# Patient Record
Sex: Female | Born: 1955 | Race: Black or African American | Hispanic: No | Smoking: Current every day smoker
Health system: Southern US, Community
[De-identification: ages and names within clinical notes are randomized; demographics above are authoritative.]

## PROBLEM LIST (undated history)

## (undated) DIAGNOSIS — E7800 Pure hypercholesterolemia, unspecified: Secondary | ICD-10-CM

## (undated) DIAGNOSIS — J40 Bronchitis, not specified as acute or chronic: Secondary | ICD-10-CM

## (undated) DIAGNOSIS — I10 Essential (primary) hypertension: Secondary | ICD-10-CM

## (undated) HISTORY — DX: Essential (primary) hypertension: I10

---

## 2018-07-05 ENCOUNTER — Emergency Department (HOSPITAL_BASED_OUTPATIENT_CLINIC_OR_DEPARTMENT_OTHER)
Admission: EM | Admit: 2018-07-05 | Discharge: 2018-07-05 | Disposition: A | Discharge status: Home / Self Care | Payer: Managed Care, Other (non HMO) | Attending: Emergency Medicine | Admitting: Emergency Medicine

## 2018-07-05 ENCOUNTER — Encounter (HOSPITAL_BASED_OUTPATIENT_CLINIC_OR_DEPARTMENT_OTHER): Payer: Self-pay | Admitting: Emergency Medicine

## 2018-07-05 ENCOUNTER — Other Ambulatory Visit: Payer: Self-pay

## 2018-07-05 ENCOUNTER — Emergency Department (HOSPITAL_BASED_OUTPATIENT_CLINIC_OR_DEPARTMENT_OTHER): Payer: Managed Care, Other (non HMO)

## 2018-07-05 DIAGNOSIS — F17210 Nicotine dependence, cigarettes, uncomplicated: Secondary | ICD-10-CM | POA: Insufficient documentation

## 2018-07-05 DIAGNOSIS — B9789 Other viral agents as the cause of diseases classified elsewhere: Secondary | ICD-10-CM

## 2018-07-05 DIAGNOSIS — J069 Acute upper respiratory infection, unspecified: Secondary | ICD-10-CM | POA: Diagnosis not present

## 2018-07-05 DIAGNOSIS — R0602 Shortness of breath: Secondary | ICD-10-CM | POA: Diagnosis present

## 2018-07-05 HISTORY — DX: Pure hypercholesterolemia, unspecified: E78.00

## 2018-07-05 HISTORY — DX: Bronchitis, not specified as acute or chronic: J40

## 2018-07-05 MED ORDER — BENZONATATE 100 MG PO CAPS: 200.0000 mg | ORAL_CAPSULE | Freq: Three times a day (TID) | ORAL | 0 refills | Status: AC | PRN

## 2018-07-05 MED ORDER — AEROCHAMBER PLUS FLO-VU MEDIUM MISC
1.0000 | Freq: Once | Status: DC
  Filled 2018-07-05: qty 1

## 2018-07-05 MED ORDER — ALBUTEROL SULFATE HFA 108 (90 BASE) MCG/ACT IN AERS
2.0000 | INHALATION_SPRAY | Freq: Once | RESPIRATORY_TRACT | Status: AC
  Administered 2018-07-05: 2 via RESPIRATORY_TRACT
  Filled 2018-07-05: qty 6.7

## 2018-07-05 MED FILL — BENZONATATE 100 MG CAP: 100 | 6 days supply | Qty: 20 | Fill #0

## 2018-07-05 NOTE — ED Triage Notes (Signed)
Dry cough started this morning.  SOB upon awakening this morning.

## 2018-07-05 NOTE — ED Notes (Signed)
Pt walked in the dept with a steady gate -- heart rate-120 -- SpO2-100% Room Air

## 2018-07-05 NOTE — ED Notes (Signed)
Patient transported to X-ray 

## 2018-07-05 NOTE — ED Notes (Signed)
Not initiating any triage orders at this time.  EDP to be in room momentarily.

## 2018-07-05 NOTE — ED Provider Notes (Signed)
[facility] EMERGENCY DEPARTMENT Provider Note   CSN: [id number] Arrival date & time: 07/05/18  4098     History   Chief Complaint Chief Complaint  Patient presents with  . Shortness of Breath    HPI Angie Roman is a 62 y.o. female.  62yo F w/ PMH including tobacco use, bronchitis, HLD who p/w cough and SOB. Pt reports getting a cough associated w/ nasal congestion yesterday. She's had nausea and 1 episode of diarrhea today. No fevers, mild headache, no chest pain. She reports some shortness of breath.  + sick contacts. She took mucinex last night without relief. No recent travel, leg swelling/pain, estrogen use, h/o clot, or h/o cancer. No SOB symptoms prior to onset of cough.   The history is provided by the patient.  Shortness of Breath     Past Medical History:  Diagnosis Date  . Bronchitis   . High cholesterol     There are no active problems to display for this patient.   History reviewed. No pertinent surgical history.   OB History   No obstetric history on file.      Home Medications    Prior to Admission medications   Medication Sig Start Date End Date Taking? Authorizing Provider  benzonatate (TESSALON) 100 MG capsule Take 2 capsules (200 mg total) by mouth 3 (three) times daily as needed for up to 20 days for cough. 07/05/18 07/25/18  Risa Auman, Ambrose Finland, MD    Family History No family history on file.  Social History Social History   Tobacco Use  . Smoking status: Current Every Day Smoker    Packs/day: 0.50    Types: Cigarettes  . Smokeless tobacco: Never Used  Substance Use Topics  . Alcohol use: Never    Frequency: Never  . Drug use: Never     Allergies   Patient has no known allergies.   Review of Systems Review of Systems  Respiratory: Positive for shortness of breath.    All other systems reviewed and are negative except that which was mentioned in HPI  Physical Exam Updated Vital Signs BP (!) 158/90 (BP  Location: Right Arm)   Pulse (!) 106   Temp 99.1 F (37.3 C) (Oral)   Resp (!) 28   Ht 5\' 5"  (1.651 m)   Wt 86.2 kg   SpO2 99%   BMI 31.62 kg/m   Physical Exam Vitals signs and nursing note reviewed.  Constitutional:      General: She is not in acute distress.    Appearance: She is well-developed.  HENT:     Head: Normocephalic and atraumatic.     Mouth/Throat:     Mouth: Mucous membranes are moist.     Pharynx: No pharyngeal swelling or oropharyngeal exudate.  Eyes:     Conjunctiva/sclera: Conjunctivae normal.  Neck:     Musculoskeletal: Neck supple.  Cardiovascular:     Rate and Rhythm: Normal rate and regular rhythm.     Heart sounds: Normal heart sounds. No murmur.  Pulmonary:     Effort: Pulmonary effort is normal.     Comments: Mildly diminished breath sounds b/l without wheezing Abdominal:     General: Bowel sounds are normal. There is no distension.     Palpations: Abdomen is soft.     Tenderness: There is no abdominal tenderness.  Musculoskeletal:     Right lower leg: She exhibits no tenderness. No edema.     Left lower leg: She exhibits no tenderness. No  edema.  Skin:    General: Skin is warm and dry.  Neurological:     Mental Status: She is alert and oriented to person, place, and time.     Comments: Fluent speech  Psychiatric:        Judgment: Judgment normal.      ED Treatments / Results  Labs (all labs ordered are listed, but only abnormal results are displayed) Labs Reviewed - No data to display  EKG None  Radiology Dg Chest 2 View  Result Date: 07/05/2018 CLINICAL DATA:  Cough and shortness of breath for 2 days EXAM: CHEST - 2 VIEW COMPARISON:  None. FINDINGS: The heart size and mediastinal contours are within normal limits. Both lungs are clear. The visualized skeletal structures are unremarkable. IMPRESSION: No active cardiopulmonary disease. Electronically Signed   By: Alcide CleverMark  Lukens M.D.   On: 07/05/2018 09:11    Procedures Procedures  (including critical care time)  Medications Ordered in ED Medications  AEROCHAMBER PLUS FLO-VU MEDIUM MISC 1 each (has no administration in time range)  albuterol (PROVENTIL HFA;VENTOLIN HFA) 108 (90 Base) MCG/ACT inhaler 2 puff (2 puffs Inhalation Given 07/05/18 0842)     Initial Impression / Assessment and Plan / ED Course  I have reviewed the triage vital signs and the nursing notes.  Pertinent  imaging results that were available during my care of the patient were reviewed by me and considered in my medical decision making (see chart for details).    PT comfortable on exam, O2 sat 100% on RA. CXR normal. Denies any symptoms of SOB prior to onset of coughing, no risk factors for PE. No chest pain. She feels improved after albuterol. No wheezing on repeat exam therefore no indication for steroids. She had normal WOB and 100% on RA when first entering room, becomes anxious and mildly tachypneic when I am talking to her. Suspect component of anxiety contributing to symptoms. Ambulated in ED with normal O2 sats.  PCP follow-up in a few days for reassessment and extensively reviewed return precautions.  She voiced understanding.  Final Clinical Impressions(s) / ED Diagnoses   Final diagnoses:  Viral URI with cough    ED Discharge Orders         Ordered    benzonatate (TESSALON) 100 MG capsule  3 times daily PRN     07/05/18 1023           Amaiya Scruton, Ambrose Finlandachel Morgan, MD 07/05/18 1029

## 2020-05-06 DIAGNOSIS — Z1339 Encounter for screening examination for other mental health and behavioral disorders: Secondary | ICD-10-CM | POA: Diagnosis not present

## 2020-05-06 DIAGNOSIS — R0602 Shortness of breath: Secondary | ICD-10-CM | POA: Diagnosis not present

## 2020-05-06 DIAGNOSIS — Z20822 Contact with and (suspected) exposure to covid-19: Secondary | ICD-10-CM | POA: Diagnosis not present

## 2020-05-06 DIAGNOSIS — Z114 Encounter for screening for human immunodeficiency virus [HIV]: Secondary | ICD-10-CM | POA: Diagnosis not present

## 2020-05-06 DIAGNOSIS — D539 Nutritional anemia, unspecified: Secondary | ICD-10-CM | POA: Diagnosis not present

## 2020-05-06 DIAGNOSIS — Z23 Encounter for immunization: Secondary | ICD-10-CM | POA: Diagnosis not present

## 2020-05-06 DIAGNOSIS — Z79899 Other long term (current) drug therapy: Secondary | ICD-10-CM | POA: Diagnosis not present

## 2020-05-06 DIAGNOSIS — Z711 Person with feared health complaint in whom no diagnosis is made: Secondary | ICD-10-CM | POA: Diagnosis not present

## 2020-05-06 DIAGNOSIS — Z1331 Encounter for screening for depression: Secondary | ICD-10-CM | POA: Diagnosis not present

## 2020-05-06 DIAGNOSIS — Z131 Encounter for screening for diabetes mellitus: Secondary | ICD-10-CM | POA: Diagnosis not present

## 2020-05-06 DIAGNOSIS — Z0000 Encounter for general adult medical examination without abnormal findings: Secondary | ICD-10-CM | POA: Diagnosis not present

## 2020-05-06 DIAGNOSIS — F17210 Nicotine dependence, cigarettes, uncomplicated: Secondary | ICD-10-CM | POA: Diagnosis not present

## 2020-05-06 DIAGNOSIS — M5116 Intervertebral disc disorders with radiculopathy, lumbar region: Secondary | ICD-10-CM | POA: Diagnosis not present

## 2020-05-06 DIAGNOSIS — E559 Vitamin D deficiency, unspecified: Secondary | ICD-10-CM | POA: Diagnosis not present

## 2020-05-06 DIAGNOSIS — Z6834 Body mass index (BMI) 34.0-34.9, adult: Secondary | ICD-10-CM | POA: Diagnosis not present

## 2020-05-06 DIAGNOSIS — E782 Mixed hyperlipidemia: Secondary | ICD-10-CM | POA: Diagnosis not present

## 2021-04-13 ENCOUNTER — Other Ambulatory Visit: Payer: Self-pay

## 2021-04-13 ENCOUNTER — Other Ambulatory Visit (HOSPITAL_BASED_OUTPATIENT_CLINIC_OR_DEPARTMENT_OTHER): Payer: Self-pay

## 2021-04-13 ENCOUNTER — Encounter: Payer: Self-pay | Admitting: *Deleted

## 2021-04-13 ENCOUNTER — Emergency Department (HOSPITAL_BASED_OUTPATIENT_CLINIC_OR_DEPARTMENT_OTHER)
Admission: EM | Admit: 2021-04-13 | Discharge: 2021-04-13 | Disposition: A | Discharge status: Home / Self Care | Payer: BC Managed Care – PPO | Attending: Emergency Medicine | Admitting: Emergency Medicine

## 2021-04-13 ENCOUNTER — Emergency Department (HOSPITAL_BASED_OUTPATIENT_CLINIC_OR_DEPARTMENT_OTHER): Payer: BC Managed Care – PPO

## 2021-04-13 ENCOUNTER — Encounter (HOSPITAL_BASED_OUTPATIENT_CLINIC_OR_DEPARTMENT_OTHER): Payer: Self-pay

## 2021-04-13 DIAGNOSIS — F17210 Nicotine dependence, cigarettes, uncomplicated: Secondary | ICD-10-CM | POA: Diagnosis not present

## 2021-04-13 DIAGNOSIS — K520 Gastroenteritis and colitis due to radiation: Secondary | ICD-10-CM | POA: Diagnosis not present

## 2021-04-13 DIAGNOSIS — R590 Localized enlarged lymph nodes: Secondary | ICD-10-CM | POA: Insufficient documentation

## 2021-04-13 DIAGNOSIS — R197 Diarrhea, unspecified: Secondary | ICD-10-CM | POA: Diagnosis not present

## 2021-04-13 DIAGNOSIS — K529 Noninfective gastroenteritis and colitis, unspecified: Secondary | ICD-10-CM | POA: Insufficient documentation

## 2021-04-13 DIAGNOSIS — R110 Nausea: Secondary | ICD-10-CM | POA: Diagnosis not present

## 2021-04-13 DIAGNOSIS — I700 Atherosclerosis of aorta: Secondary | ICD-10-CM | POA: Diagnosis not present

## 2021-04-13 DIAGNOSIS — K6389 Other specified diseases of intestine: Secondary | ICD-10-CM | POA: Diagnosis not present

## 2021-04-13 LAB — CBC WITH DIFFERENTIAL/PLATELET
Abs Immature Granulocytes: 0.03 10*3/uL (ref 0.00–0.07)
Basophils Absolute: 0 10*3/uL (ref 0.0–0.1)
Basophils Relative: 1 %
Eosinophils Absolute: 0 10*3/uL (ref 0.0–0.5)
Eosinophils Relative: 0 %
HCT: 40.9 % (ref 36.0–46.0)
Hemoglobin: 13.7 g/dL (ref 12.0–15.0)
Immature Granulocytes: 0 %
Lymphocytes Relative: 25 %
Lymphs Abs: 2.2 10*3/uL (ref 0.7–4.0)
MCH: 29 pg (ref 26.0–34.0)
MCHC: 33.5 g/dL (ref 30.0–36.0)
MCV: 86.7 fL (ref 80.0–100.0)
Monocytes Absolute: 0.6 10*3/uL (ref 0.1–1.0)
Monocytes Relative: 7 %
Neutro Abs: 5.8 10*3/uL (ref 1.7–7.7)
Neutrophils Relative %: 67 %
Platelets: 263 10*3/uL (ref 150–400)
RBC: 4.72 MIL/uL (ref 3.87–5.11)
RDW: 12.6 % (ref 11.5–15.5)
WBC: 8.8 10*3/uL (ref 4.0–10.5)
nRBC: 0 % (ref 0.0–0.2)

## 2021-04-13 LAB — COMPREHENSIVE METABOLIC PANEL
ALT: 18 U/L (ref 0–44)
AST: 22 U/L (ref 15–41)
Albumin: 4.1 g/dL (ref 3.5–5.0)
Alkaline Phosphatase: 87 U/L (ref 38–126)
Anion gap: 7 (ref 5–15)
BUN: 13 mg/dL (ref 8–23)
CO2: 26 mmol/L (ref 22–32)
Calcium: 9.1 mg/dL (ref 8.9–10.3)
Chloride: 106 mmol/L (ref 98–111)
Creatinine, Ser: 0.55 mg/dL (ref 0.44–1.00)
GFR, Estimated: >60 mL/min (ref >60)
Glucose, Bld: 109 mg/dL — ABNORMAL HIGH (ref 70–99)
Potassium: 3.3 mmol/L — ABNORMAL LOW (ref 3.5–5.1)
Sodium: 139 mmol/L (ref 135–145)
Total Bilirubin: 0.4 mg/dL (ref 0.3–1.2)
Total Protein: 7.4 g/dL (ref 6.5–8.1)

## 2021-04-13 LAB — LIPASE, BLOOD: Lipase: 25 U/L (ref 11–51)

## 2021-04-13 MED ORDER — IOHEXOL 350 MG/ML SOLN
100.0000 mL | Freq: Once | INTRAVENOUS | Status: AC | PRN
  Administered 2021-04-13: 85 mL via INTRAVENOUS

## 2021-04-13 MED ORDER — AZITHROMYCIN 500 MG PO TABS
500.0000 mg | ORAL_TABLET | Freq: Every day | ORAL | 0 refills | Status: AC
  Filled 2021-04-13: qty 5, 5d supply, fill #0

## 2021-04-13 MED ORDER — SODIUM CHLORIDE 0.9 % IV BOLUS
500.0000 mL | Freq: Once | INTRAVENOUS | Status: AC
  Administered 2021-04-13: 500 mL via INTRAVENOUS

## 2021-04-13 MED ORDER — DICYCLOMINE HCL 20 MG PO TABS
20.0000 mg | ORAL_TABLET | Freq: Three times a day (TID) | ORAL | 0 refills | Status: DC | PRN
  Filled 2021-04-13: qty 20, 7d supply, fill #0

## 2021-04-13 NOTE — ED Provider Notes (Signed)
Emergency Department Provider Note   I have reviewed the triage vital signs and the nursing notes.   HISTORY  Chief Complaint Diarrhea   HPI Angie Roman is a 65 y.o. female past medical history reviewed below presents to the emergency department with lower abdominal pain and some bloody/mucousy diarrhea.  Patient has not had fevers.  She denies recent antibiotics or hospitalization.  She is not having rectal pain.  Symptoms began yesterday and she has been passing small volume (1-2 tablespoons) worth of diarrhea.  She brought a sample to the emergency department.  She is not anticoagulated.  Denies any dysuria, hesitancy, urgency.  No radiation of symptoms or other modifying factors.   Past Medical History:  Diagnosis Date   Bronchitis    High cholesterol     There are no problems to display for this patient.   History reviewed. No pertinent surgical history.  Allergies Patient has no known allergies.  No family history on file.  Social History Social History   Tobacco Use   Smoking status: Every Day    Packs/day: 0.50    Types: Cigarettes   Smokeless tobacco: Never  Vaping Use   Vaping Use: Never used  Substance Use Topics   Alcohol use: Yes    Comment: occ   Drug use: Never    Review of Systems  Constitutional: No fever/chills Eyes: No visual changes. ENT: No sore throat. Cardiovascular: Denies chest pain. Respiratory: Denies shortness of breath. Gastrointestinal: Positive lower abdominal pain.  No nausea, no vomiting. Positive bloody/mucous diarrhea.  No constipation. Genitourinary: Negative for dysuria. Musculoskeletal: Negative for back pain. Skin: Negative for rash. Neurological: Negative for headaches, focal weakness or numbness.  10-point ROS otherwise negative.  ____________________________________________   PHYSICAL EXAM:  VITAL SIGNS: ED Triage Vitals  Enc Vitals Group     BP 04/13/21 1155 (!) 156/92     Pulse Rate 04/13/21 1155  99     Resp 04/13/21 1155 18     Temp 04/13/21 1155 98.8 F (37.1 C)     Temp Source 04/13/21 1155 Oral     SpO2 04/13/21 1155 98 %     Weight 04/13/21 1157 201 lb (91.2 kg)     Height 04/13/21 1157 5\' 5"  (1.651 m)   Constitutional: Alert and oriented. Well appearing and in no acute distress. Eyes: Conjunctivae are normal.  Head: Atraumatic. Nose: No congestion/rhinnorhea. Mouth/Throat: Mucous membranes are moist.   Neck: No stridor.   Cardiovascular: Normal rate, regular rhythm. Good peripheral circulation. Grossly normal heart sounds.   Respiratory: Normal respiratory effort.  No retractions. Lungs CTAB. Gastrointestinal: Soft and nontender. No distention.  Musculoskeletal: No gross deformities of extremities. Neurologic:  Normal speech and language. No gross focal neurologic deficits are appreciated.  Skin:  Skin is warm, dry and intact. No rash noted.   ____________________________________________   LABS (all labs ordered are listed, but only abnormal results are displayed)  Labs Reviewed  COMPREHENSIVE METABOLIC PANEL - Abnormal; Notable for the following components:      Result Value   Potassium 3.3 (*)    Glucose, Bld 109 (*)    All other components within normal limits  GASTROINTESTINAL PANEL BY PCR, STOOL (REPLACES STOOL CULTURE)  LIPASE, BLOOD  CBC WITH DIFFERENTIAL/PLATELET   ____________________________________________  RADIOLOGY   CT reviewed. No acute findings.   ____________________________________________   PROCEDURES  Procedure(s) performed:   Procedures  None ____________________________________________   INITIAL IMPRESSION / ASSESSMENT AND PLAN / ED COURSE  Pertinent  labs & imaging results that were available during my care of the patient were reviewed by me and considered in my medical decision making (see chart for details).   Patient presents to the emergency department for evaluation of lower abdominal pain with small-volume, bloody  diarrhea.  Patient has a sample at bedside which we will send for C. difficile stool panel. Abdomen with mild lower abdominal tenderness without peritonitis. Plan for labs, IVF, and CT.   Differential diagnosis includes but is not exclusive to ectopic pregnancy, ovarian cyst, ovarian torsion, acute appendicitis, urinary tract infection, endometriosis, bowel obstruction, hernia, colitis, renal colic, gastroenteritis, volvulus etc.   CT with colitis type changes. Discussed lymphadenopathy and referred to Oncology. Patient has a GI specialist as well. Stool studies sent. Plan for abx and GI/Onc follow up. Discussed strict ED return precautions.  ____________________________________________  FINAL CLINICAL IMPRESSION(S) / ED DIAGNOSES  Final diagnoses:  Diarrhea of presumed infectious origin  Colitis  Abdominal lymphadenopathy     MEDICATIONS GIVEN DURING THIS VISIT:  Medications  sodium chloride 0.9 % bolus 500 mL (0 mLs Intravenous Stopped 04/13/21 1505)  iohexol (OMNIPAQUE) 350 MG/ML injection 100 mL (85 mLs Intravenous Contrast Given 04/13/21 1352)     NEW OUTPATIENT MEDICATIONS STARTED DURING THIS VISIT:  Discharge Medication List as of 04/13/2021  3:22 PM     START taking these medications   Details  azithromycin (ZITHROMAX) 500 MG tablet Take 1 tablet (500 mg total) by mouth daily for 5 days., Starting Mon 04/13/2021, Until Sat 04/18/2021, Normal    dicyclomine (BENTYL) 20 MG tablet Take 1 tablet (20 mg total) by mouth 3 (three) times daily as needed for spasms., Starting Mon 04/13/2021, Normal        Note:  This document was prepared using Dragon voice recognition software and may include unintentional dictation errors.  Alona Bene, MD, [facility]    Cashawn Yanko, Arlyss Repress, MD 04/17/21 [phone]

## 2021-04-13 NOTE — ED Notes (Signed)
Patient transported to CT 

## 2021-04-13 NOTE — Discharge Instructions (Signed)
You were seen in the ED today with abdominal pain and diarrhea. Your CT scan shows some inflammation in the colon and swollen lymph nodes in the abdomen and pelvis. The radiology team is recommending follow up with a hematology/oncology group. I have placed a referral but please consult with your primary care doctor for follow up as well. Please also alert your GI doctors, who did your colonoscopy, that you are having bleeding. Return to the ED with any new or worsening symptoms.

## 2021-04-13 NOTE — ED Triage Notes (Signed)
Pt c/o diarrhea, occ abd cramps started ~10pm-diarrhea with blood started ~5am-NAD-steady gait

## 2021-04-13 NOTE — Progress Notes (Signed)
Reached out to Alba Destine to introduce myself as the office RN Navigator and explain our new patient process. Reviewed the reason for their referral and scheduled their new patient appointment along with labs. Provided address and directions to the office including call back phone number. Reviewed with patient any concerns they may have or any possible barriers to attending their appointment.   Informed patient about my role as a navigator and that I will meet with them prior to their New Patient appointment and more fully discuss what services I can provide. At this time patient has no further questions or needs.    Oncology Nurse Navigator Documentation  Oncology Nurse Navigator Flowsheets 04/13/2021  Abnormal Finding Date 04/13/2021  Diagnosis Status Additional Work Up  Navigator Follow Up Date: 04/20/2021  Navigator Follow Up Reason: Abnormal Scan  Navigator Location [facility]  Referral Date to RadOnc/MedOnc 04/13/2021  Navigator Encounter Type Introductory Phone Call  Patient Visit Type MedOnc  Treatment Phase Abnormal Scans  Barriers/Navigation Needs Coordination of Care;Education  Education Other  Interventions Coordination of Care;Education  Acuity Level 2-Minimal Needs (1-2 Barriers Identified)  Coordination of Care Appts  Education Method Verbal  Time Spent with Patient 45

## 2021-04-14 LAB — GASTROINTESTINAL PANEL BY PCR, STOOL (REPLACES STOOL CULTURE)

## 2021-04-17 ENCOUNTER — Telehealth: Payer: Self-pay | Admitting: *Deleted

## 2021-04-17 DIAGNOSIS — R197 Diarrhea, unspecified: Secondary | ICD-10-CM | POA: Diagnosis not present

## 2021-04-17 DIAGNOSIS — R590 Localized enlarged lymph nodes: Secondary | ICD-10-CM | POA: Diagnosis not present

## 2021-04-17 DIAGNOSIS — R110 Nausea: Secondary | ICD-10-CM | POA: Diagnosis not present

## 2021-04-17 DIAGNOSIS — K219 Gastro-esophageal reflux disease without esophagitis: Secondary | ICD-10-CM | POA: Diagnosis not present

## 2021-04-17 NOTE — Telephone Encounter (Signed)
Message received from patient stating that she would like to cancel appts scheduled for Monday, 04/20/21 and that she will call to reschedule.  Appts canceled per pt.'s request.

## 2021-04-20 ENCOUNTER — Other Ambulatory Visit: Payer: BC Managed Care – PPO

## 2021-04-20 ENCOUNTER — Ambulatory Visit: Payer: BC Managed Care – PPO | Admitting: Hematology & Oncology

## 2021-04-21 ENCOUNTER — Telehealth: Payer: Self-pay | Admitting: *Deleted

## 2021-04-21 ENCOUNTER — Encounter: Payer: Self-pay | Admitting: *Deleted

## 2021-04-21 DIAGNOSIS — R590 Localized enlarged lymph nodes: Secondary | ICD-10-CM | POA: Diagnosis not present

## 2021-04-21 DIAGNOSIS — K219 Gastro-esophageal reflux disease without esophagitis: Secondary | ICD-10-CM | POA: Diagnosis not present

## 2021-04-21 DIAGNOSIS — K449 Diaphragmatic hernia without obstruction or gangrene: Secondary | ICD-10-CM | POA: Diagnosis not present

## 2021-04-21 DIAGNOSIS — K640 First degree hemorrhoids: Secondary | ICD-10-CM | POA: Diagnosis not present

## 2021-04-21 DIAGNOSIS — D125 Benign neoplasm of sigmoid colon: Secondary | ICD-10-CM | POA: Diagnosis not present

## 2021-04-21 DIAGNOSIS — R197 Diarrhea, unspecified: Secondary | ICD-10-CM | POA: Diagnosis not present

## 2021-04-21 DIAGNOSIS — K635 Polyp of colon: Secondary | ICD-10-CM | POA: Diagnosis not present

## 2021-04-21 DIAGNOSIS — K921 Melena: Secondary | ICD-10-CM | POA: Diagnosis not present

## 2021-04-21 DIAGNOSIS — R110 Nausea: Secondary | ICD-10-CM | POA: Diagnosis not present

## 2021-04-21 DIAGNOSIS — K3189 Other diseases of stomach and duodenum: Secondary | ICD-10-CM | POA: Diagnosis not present

## 2021-04-21 HISTORY — PX: COLONOSCOPY WITH ESOPHAGOGASTRODUODENOSCOPY (EGD): SHX5779

## 2021-04-21 NOTE — Progress Notes (Signed)
Patient had to cancel her New Patient appointment for 04/20/2021. She called back today to reschedule to 04/27/21.  Oncology Nurse Navigator Documentation  Oncology Nurse Navigator Flowsheets 04/21/2021  Abnormal Finding Date -  Diagnosis Status -  Navigator Follow Up Date: 04/27/2021  Navigator Follow Up Reason: New Patient Appointment  Navigator Location [facility]  Referral Date to RadOnc/MedOnc -  Navigator Encounter Type Appt/Treatment Plan Review  Patient Visit Type MedOnc  Treatment Phase Abnormal Scans  Barriers/Navigation Needs -  Education -  Interventions None Required  Acuity Level 2-Minimal Needs (1-2 Barriers Identified)  Coordination of Care -  Education Method -  Time Spent with Patient 15

## 2021-04-21 NOTE — Telephone Encounter (Signed)
Referral has been deferred until patient calls back to reschedule appointment.

## 2021-04-21 NOTE — Telephone Encounter (Signed)
Patient called to reschedule new patient appointment with Dr. Myna Hidalgo - confirmed - mailed welcome packet with calendar.

## 2021-04-27 ENCOUNTER — Encounter: Payer: Self-pay | Admitting: *Deleted

## 2021-04-27 ENCOUNTER — Inpatient Hospital Stay (HOSPITAL_BASED_OUTPATIENT_CLINIC_OR_DEPARTMENT_OTHER): Payer: BC Managed Care – PPO | Admitting: Hematology & Oncology

## 2021-04-27 ENCOUNTER — Encounter: Payer: Self-pay | Admitting: Hematology & Oncology

## 2021-04-27 ENCOUNTER — Other Ambulatory Visit: Payer: Self-pay

## 2021-04-27 ENCOUNTER — Inpatient Hospital Stay: Payer: BC Managed Care – PPO | Attending: Hematology & Oncology

## 2021-04-27 VITALS — BP 164/93 | HR 79 | Temp 98.3°F | Resp 18 | Ht 65.0 in | Wt 206.0 lb

## 2021-04-27 DIAGNOSIS — R590 Localized enlarged lymph nodes: Secondary | ICD-10-CM | POA: Insufficient documentation

## 2021-04-27 DIAGNOSIS — F17210 Nicotine dependence, cigarettes, uncomplicated: Secondary | ICD-10-CM | POA: Diagnosis not present

## 2021-04-27 DIAGNOSIS — Z803 Family history of malignant neoplasm of breast: Secondary | ICD-10-CM | POA: Insufficient documentation

## 2021-04-27 LAB — RETICULOCYTES
Immature Retic Fract: 7.2 % (ref 2.3–15.9)
RBC.: 4.68 MIL/uL (ref 3.87–5.11)
Retic Count, Absolute: 76.8 10*3/uL (ref 19.0–186.0)
Retic Ct Pct: 1.6 % (ref 0.4–3.1)

## 2021-04-27 LAB — CMP (CANCER CENTER ONLY)
ALT: 14 U/L (ref 0–44)
AST: 13 U/L — ABNORMAL LOW (ref 15–41)
Albumin: 4.3 g/dL (ref 3.5–5.0)
Alkaline Phosphatase: 79 U/L (ref 38–126)
Anion gap: 6 (ref 5–15)
BUN: 13 mg/dL (ref 8–23)
CO2: 30 mmol/L (ref 22–32)
Calcium: 10 mg/dL (ref 8.9–10.3)
Chloride: 103 mmol/L (ref 98–111)
Creatinine: 0.68 mg/dL (ref 0.44–1.00)
GFR, Estimated: >60 mL/min (ref >60)
Glucose, Bld: 96 mg/dL (ref 70–99)
Potassium: 4.1 mmol/L (ref 3.5–5.1)
Sodium: 139 mmol/L (ref 135–145)
Total Bilirubin: 0.4 mg/dL (ref 0.3–1.2)
Total Protein: 7.1 g/dL (ref 6.5–8.1)

## 2021-04-27 LAB — CBC WITH DIFFERENTIAL (CANCER CENTER ONLY)
Abs Immature Granulocytes: 0.04 10*3/uL (ref 0.00–0.07)
Basophils Absolute: 0 10*3/uL (ref 0.0–0.1)
Basophils Relative: 1 %
Eosinophils Absolute: 0 10*3/uL (ref 0.0–0.5)
Eosinophils Relative: 0 %
HCT: 41.6 % (ref 36.0–46.0)
Hemoglobin: 13.6 g/dL (ref 12.0–15.0)
Immature Granulocytes: 1 %
Lymphocytes Relative: 40 %
Lymphs Abs: 2.2 10*3/uL (ref 0.7–4.0)
MCH: 28.8 pg (ref 26.0–34.0)
MCHC: 32.7 g/dL (ref 30.0–36.0)
MCV: 87.9 fL (ref 80.0–100.0)
Monocytes Absolute: 0.3 10*3/uL (ref 0.1–1.0)
Monocytes Relative: 5 %
Neutro Abs: 2.9 10*3/uL (ref 1.7–7.7)
Neutrophils Relative %: 53 %
Platelet Count: 265 10*3/uL (ref 150–400)
RBC: 4.73 MIL/uL (ref 3.87–5.11)
RDW: 12.3 % (ref 11.5–15.5)
WBC Count: 5.6 10*3/uL (ref 4.0–10.5)
nRBC: 0 % (ref 0.0–0.2)

## 2021-04-27 LAB — SAVE SMEAR(SSMR), FOR PROVIDER SLIDE REVIEW

## 2021-04-27 LAB — LACTATE DEHYDROGENASE: LDH: 189 U/L (ref 98–192)

## 2021-04-27 NOTE — Progress Notes (Signed)
Initial RN Navigator Patient Visit  Name: Angie Roman Date of Referral : 04/13/2021 Diagnosis: Lymphadenopathy  Met with patient prior to their visit with MD. Hanley Seamen patient "Your Patient Navigator" handout which explains my role, areas in which I am able to help, and all the contact information for myself and the office. Also gave patient MD and Navigator business card. Reviewed with patient the general overview of expected course after initial diagnosis and time frame for all steps to be completed.  Patient here alone. She lives with a 14yo granddaughter and has many supportive family members in the area. She still works but has a flexible schedule.   Patient completed visit with Dr. Marin Olp.   Patient will need a PET scan. Will wait for insurance authorization and then schedule.   Patient understands all follow up procedures and expectations. They have my number to reach out for any further clarification or additional needs.   Oncology Nurse Navigator Documentation  Oncology Nurse Navigator Flowsheets 04/27/2021  Abnormal Finding Date -  Diagnosis Status -  Navigator Follow Up Date: 04/29/2021  Navigator Follow Up Reason: Appointment Review  Navigator Location [facility]  Referral Date to RadOnc/MedOnc -  Navigator Encounter Type Initial MedOnc  Patient Visit Type MedOnc  Treatment Phase Abnormal Scans  Barriers/Navigation Needs Coordination of Care;Education  Education Other  Interventions Education;Psycho-Social Support  Acuity Level 2-Minimal Needs (1-2 Barriers Identified)  Coordination of Care -  Education Method Verbal;Written  Support Groups/Services Friends and Family  Time Spent with Patient 30

## 2021-04-27 NOTE — Progress Notes (Signed)
Referral MD  Reason for Referral: Pelvic lymphadenopathy  Chief Complaint  Patient presents with   New Patient (Initial Visit)  : I was told I had some big lymph nodes.  HPI: Angie Roman is a very charming 65 year old Afro-American female.  She is from [place].  She has a son.  She currently is helping take care of a granddaughter.  She works for a Product/process development scientist.  She has been very healthy.  She really has not had any medications that she has been taking.  She is up-to-date with her mammogram.  She has not had a hysterectomy.  She went to the emergency room back in September.  She had a lot of abdominal pain and diarrhea with blood.  She had nausea.  She did have a CT scan done on 04/13/2021.  This showed diffuse thickening of the distal transverse and descending colon insistent with a colitis.  However, there was some lymphadenopathy in the pelvis.  She had right inguinal lymph node measuring 1.3 cm.  Left inguinal lymph node measured 2.4 cm.  There is some enlarged left common iliac lymph nodes and other lymph nodes in the pelvis.  No biopsy have been done.  No additional studies have been done.  She was calmly referred to the [facility] for an evaluation.  She has had no problems since this bout of colitis.  She has had a colonoscopy and upper endoscopy. This was done on 04/21/2021.  The colonoscopy showed some polyps that were removed.  The upper endoscopy was unremarkable.  She has had no weight loss or weight gain.  Her appetite is doing okay.  She has had no cough or shortness of breath.  There is been no problems with COVID.  She does not smoke.  She probably has about a 40-pack-year history of tobacco use.  There is no history in the family of a type of abdominal issues.  There is history of breast cancer in the family.  She still has her own uterus and ovaries.  She has had no rashes.  There is been no swollen lymph nodes.  She has had no bladder issues.   She has had no bleeding.  Overall, I would have to say that her performance status is ECOG 0.   Past Medical History:  Diagnosis Date   Bronchitis    High cholesterol   :  History reviewed. No pertinent surgical history.:   Current Outpatient Medications:    cyclobenzaprine (FLEXERIL) 10 MG tablet, Take 5-10 mg by mouth every 8 (eight) hours as needed., Disp: , Rfl:    dicyclomine (BENTYL) 20 MG tablet, Take 1 tablet (20 mg total) by mouth 3 (three) times daily as needed for spasms., Disp: 20 tablet, Rfl: 0:  :   Allergies  Allergen Reactions   Iodinated Diagnostic Agents Rash  :  History reviewed. No pertinent family history.:   Social History   Socioeconomic History   Marital status: Widowed    Spouse name: Not on file   Number of children: Not on file   Years of education: Not on file   Highest education level: Not on file  Occupational History   Not on file  Tobacco Use   Smoking status: Every Day    Packs/day: 0.50    Types: Cigarettes   Smokeless tobacco: Never  Vaping Use   Vaping Use: Never used  Substance and Sexual Activity   Alcohol use: Yes    Comment: occ  Drug use: Never   Sexual activity: Not on file  Other Topics Concern   Not on file  Social History Narrative   Not on file   Social Determinants of Health   Financial Resource Strain: Not on file  Food Insecurity: Not on file  Transportation Needs: Not on file  Physical Activity: Not on file  Stress: Not on file  Social Connections: Not on file  Intimate Partner Violence: Not on file  :  Review of Systems  Constitutional: Negative.   HENT: Negative.    Eyes: Negative.   Respiratory: Negative.    Cardiovascular: Negative.   Gastrointestinal: Negative.   Genitourinary: Negative.   Musculoskeletal: Negative.   Skin: Negative.   Neurological: Negative.   Endo/Heme/Allergies: Negative.   Psychiatric/Behavioral: Negative.      Exam: @IPVITALS @ Physical Exam Vitals  reviewed.  HENT:     Head: Normocephalic and atraumatic.  Eyes:     Pupils: Pupils are equal, round, and reactive to light.  Cardiovascular:     Rate and Rhythm: Normal rate and regular rhythm.     Heart sounds: Normal heart sounds.  Pulmonary:     Effort: Pulmonary effort is normal.     Breath sounds: Normal breath sounds.  Abdominal:     General: Bowel sounds are normal.     Palpations: Abdomen is soft.  Musculoskeletal:        General: No tenderness or deformity. Normal range of motion.     Cervical back: Normal range of motion.  Lymphadenopathy:     Cervical: No cervical adenopathy.  Skin:    General: Skin is warm and dry.     Findings: No erythema or rash.  Neurological:     Mental Status: She is alert and oriented to person, place, and time.  Psychiatric:        Behavior: Behavior normal.        Thought Content: Thought content normal.        Judgment: Judgment normal.      Recent Labs    04/27/21 1109  WBC 5.6  HGB 13.6  HCT 41.6  PLT 265    Recent Labs    04/27/21 1109  NA 139  K 4.1  CL 103  CO2 30  GLUCOSE 96  BUN 13  CREATININE 0.68  CALCIUM 10.0    Blood smear review: None  Pathology: None    Assessment and Plan: Angie Roman is a very charming 65 year old African-American female.  She had a CT scan that showed some pelvic lymphadenopathy.  This is totally nondescriptive.  It is possible that this might be malignant.  It could be reactive.  I think that the best way to go is to get a PET scan on her.  We can see if these lymph nodes are active and how active they are.  We can see if there is lymph nodes elsewhere.  I would have to think there frequently get a biopsy, this positive and had to be some kind of surgical procedure.  She looks quite healthy.  She has a great performance status.  She has a strong faith.  I gave her a prayer blanket.  I told her that we just do not know what is going on as of yet.  The PET scan can certainly guide  76 as to how we can try to proceed.  Ultimately, she will need to have a biopsy done.  Once we have the PET scan result back, I will get  back with her and then we can figure out what our next step will be.

## 2021-04-28 ENCOUNTER — Telehealth: Payer: Self-pay | Admitting: *Deleted

## 2021-04-28 LAB — IRON AND TIBC
Iron: 71 ug/dL (ref 41–142)
Saturation Ratios: 24 % (ref 21–57)
TIBC: 295 ug/dL (ref 236–444)
UIBC: 224 ug/dL (ref 120–384)

## 2021-04-28 LAB — BETA 2 MICROGLOBULIN, SERUM: Beta-2 Microglobulin: 1.1 mg/L (ref 0.6–2.4)

## 2021-04-28 LAB — FERRITIN: Ferritin: 110 ng/mL (ref 11–307)

## 2021-04-28 LAB — CA 125: Cancer Antigen (CA) 125: 2.2 U/mL (ref 0.0–38.1)

## 2021-04-28 LAB — CEA (IN HOUSE-CHCC): CEA (CHCC-In House): 1.91 ng/mL (ref 0.00–5.00)

## 2021-04-28 NOTE — Telephone Encounter (Signed)
No 04/27/21 LOS 

## 2021-04-29 ENCOUNTER — Encounter: Payer: Self-pay | Admitting: *Deleted

## 2021-04-29 NOTE — Progress Notes (Signed)
PET approved. Scheduled for 05/11/2021.  Called patient and reviewed appointment with her, including time, date and location. Also reviewed prep with her including arrival time, and NPO except water. She confirmed teaching. I also mailed her a calendar and radiology info sheet with all the above for education reinforcement.   Oncology Nurse Navigator Documentation  Oncology Nurse Navigator Flowsheets 04/29/2021  Abnormal Finding Date -  Diagnosis Status -  Navigator Follow Up Date: 05/11/2021  Navigator Follow Up Reason: Scan Review  Navigator Location [facility]  Referral Date to RadOnc/MedOnc -  Navigator Encounter Type Appt/Treatment Plan Review;Telephone  Telephone Education;Appt Confirmation/Clarification;Outgoing Call  Patient Visit Type MedOnc  Treatment Phase Abnormal Scans  Barriers/Navigation Needs Coordination of Care;Education  Education Other  Interventions Coordination of Care;Education;Psycho-Social Support  Acuity Level 2-Minimal Needs (1-2 Barriers Identified)  Coordination of Care Radiology  Education Method Verbal;Teach-back;Written  Support Groups/Services Friends and Family  Time Spent with Patient 30

## 2021-05-11 ENCOUNTER — Other Ambulatory Visit: Payer: Self-pay

## 2021-05-11 ENCOUNTER — Ambulatory Visit (HOSPITAL_COMMUNITY)
Admission: RE | Admit: 2021-05-11 | Discharge: 2021-05-11 | Disposition: A | Discharge status: Home / Self Care | Payer: BC Managed Care – PPO | Source: Ambulatory Visit | Attending: Hematology & Oncology | Admitting: Hematology & Oncology

## 2021-05-11 DIAGNOSIS — R590 Localized enlarged lymph nodes: Secondary | ICD-10-CM | POA: Diagnosis not present

## 2021-05-11 DIAGNOSIS — I700 Atherosclerosis of aorta: Secondary | ICD-10-CM | POA: Diagnosis not present

## 2021-05-11 DIAGNOSIS — J351 Hypertrophy of tonsils: Secondary | ICD-10-CM | POA: Diagnosis not present

## 2021-05-11 DIAGNOSIS — I2510 Atherosclerotic heart disease of native coronary artery without angina pectoris: Secondary | ICD-10-CM | POA: Diagnosis not present

## 2021-05-11 LAB — GLUCOSE, CAPILLARY: Glucose-Capillary: 104 mg/dL — ABNORMAL HIGH (ref 70–99)

## 2021-05-11 MED ORDER — FLUDEOXYGLUCOSE F - 18 (FDG) INJECTION
10.6000 | Freq: Once | INTRAVENOUS | Status: AC | PRN
  Administered 2021-05-11: 10.58 via INTRAVENOUS

## 2021-05-12 ENCOUNTER — Encounter: Payer: Self-pay | Admitting: *Deleted

## 2021-05-12 NOTE — Progress Notes (Signed)
Reviewed PET scan. Message sent to Dr Myna Hidalgo to determine next step.   Oncology Nurse Navigator Documentation  Oncology Nurse Navigator Flowsheets 05/12/2021  Abnormal Finding Date -  Diagnosis Status -  Navigator Follow Up Date: -  Navigator Follow Up Reason: -  Navigator Location [facility]  Referral Date to RadOnc/MedOnc -  Navigator Encounter Type Scan Review  Telephone -  Patient Visit Type MedOnc  Treatment Phase Abnormal Scans  Barriers/Navigation Needs Coordination of Care;Education  Education -  Interventions Coordination of Care  Acuity Level 2-Minimal Needs (1-2 Barriers Identified)  Coordination of Care Other  Education Method -  Support Groups/Services -  Time Spent with Patient 15

## 2021-05-13 ENCOUNTER — Encounter: Payer: Self-pay | Admitting: *Deleted

## 2021-05-13 DIAGNOSIS — R590 Localized enlarged lymph nodes: Secondary | ICD-10-CM

## 2021-05-13 NOTE — Progress Notes (Signed)
Received the following message regarding the PET results:  Demonica Farrey:  we need to get her to surgery for an exscional  bx.  We need to have an entire node removed!!  Pete  Referral order placed to [facility] for excisional LN biopsy.   Called patient and she is aware of PET results and that [facility] will be calling her to schedule an appointment to discuss biopsy. Requested that she call me 05/15/2021 if she hadn't heard from them.  Oncology Nurse Navigator Documentation  Oncology Nurse Navigator Flowsheets 05/13/2021  Abnormal Finding Date -  Diagnosis Status -  Navigator Follow Up Date: 05/15/2021  Navigator Follow Up Reason: Appointment Review  Navigator Location [facility]  Referral Date to RadOnc/MedOnc -  Navigator Encounter Type Telephone  Telephone Diagnostic Results;Appt Confirmation/Clarification;Outgoing Call  Patient Visit Type MedOnc  Treatment Phase Abnormal Scans  Barriers/Navigation Needs Coordination of Care;Education  Education Other  Interventions Education;Psycho-Social Support;Referrals  Acuity Level 2-Minimal Needs (1-2 Barriers Identified)  Referrals Other  Coordination of Care -  Education Method Verbal  Support Groups/Services Friends and Family  Time Spent with Patient 30

## 2021-05-15 ENCOUNTER — Encounter: Payer: Self-pay | Admitting: *Deleted

## 2021-05-15 NOTE — Progress Notes (Signed)
Oncology Nurse Navigator Documentation  Oncology Nurse Navigator Flowsheets 05/15/2021  Abnormal Finding Date -  Diagnosis Status -  Navigator Follow Up Date: 05/29/2021  Navigator Follow Up Reason: Other:  Navigator Location [facility]  Referral Date to RadOnc/MedOnc -  Navigator Encounter Type Appt/Treatment Plan Review  Telephone -  Patient Visit Type MedOnc  Treatment Phase Abnormal Scans  Barriers/Navigation Needs Coordination of Care;Education  Education -  Interventions None Required  Acuity Level 2-Minimal Needs (1-2 Barriers Identified)  Referrals -  Coordination of Care -  Education Method -  Support Groups/Services Friends and Family  Time Spent with Patient 15

## 2021-05-23 DIAGNOSIS — Z1231 Encounter for screening mammogram for malignant neoplasm of breast: Secondary | ICD-10-CM | POA: Diagnosis not present

## 2021-05-23 DIAGNOSIS — Z1239 Encounter for other screening for malignant neoplasm of breast: Secondary | ICD-10-CM | POA: Diagnosis not present

## 2021-05-29 ENCOUNTER — Encounter: Payer: Self-pay | Admitting: *Deleted

## 2021-05-29 ENCOUNTER — Ambulatory Visit: Payer: Self-pay | Admitting: Surgery

## 2021-05-29 DIAGNOSIS — R590 Localized enlarged lymph nodes: Secondary | ICD-10-CM | POA: Diagnosis not present

## 2021-05-29 NOTE — Progress Notes (Signed)
Patient seen by [facility] and an excisional biopsy is scheduled for 06/05/21.  Oncology Nurse Navigator Documentation  Oncology Nurse Navigator Flowsheets 05/29/2021  Abnormal Finding Date -  Diagnosis Status -  Navigator Follow Up Date: 06/05/2021  Navigator Follow Up Reason: Surgery  Navigator Location [facility]  Referral Date to RadOnc/MedOnc -  Navigator Encounter Type Appt/Treatment Plan Review  Telephone -  Patient Visit Type MedOnc  Treatment Phase Abnormal Scans  Barriers/Navigation Needs Coordination of Care;Education  Education -  Interventions None Required  Acuity Level 2-Minimal Needs (1-2 Barriers Identified)  Referrals -  Coordination of Care -  Education Method -  Support Groups/Services Friends and Family  Time Spent with Patient 15

## 2021-06-03 ENCOUNTER — Encounter (HOSPITAL_BASED_OUTPATIENT_CLINIC_OR_DEPARTMENT_OTHER): Payer: Self-pay | Admitting: Surgery

## 2021-06-03 ENCOUNTER — Other Ambulatory Visit: Payer: Self-pay

## 2021-06-03 NOTE — Progress Notes (Signed)
Spoke w/ via phone for pre-op interview---pt Lab needs dos---- none              Lab results------none COVID test -----patient states asymptomatic no test needed Arrive at -------1315 on 06/05/21 NPO after MN NO Solid Food.  Clear liquids from MN until---1215 Med rec completed Medications to take morning of surgery -----Flexeril prn Diabetic medication -----n/a Patient instructed no nail polish to be worn day of surgery Patient instructed to bring photo id and insurance card day of surgery Patient aware to have Driver (ride ) / caregiver    for 24 hours after surgery  - cousin [place] Patient Special Instructions -----none Pre-Op special Istructions -----none Patient verbalized understanding of instructions that were given at this phone interview. Patient denies shortness of breath, chest pain, fever, cough at this phone interview.

## 2021-06-05 ENCOUNTER — Ambulatory Visit (HOSPITAL_BASED_OUTPATIENT_CLINIC_OR_DEPARTMENT_OTHER)
Admission: RE | Admit: 2021-06-05 | Discharge: 2021-06-05 | Disposition: A | Discharge status: Home / Self Care | Payer: BC Managed Care – PPO | Attending: Surgery | Admitting: Surgery

## 2021-06-05 ENCOUNTER — Encounter (HOSPITAL_BASED_OUTPATIENT_CLINIC_OR_DEPARTMENT_OTHER)
Admission: RE | Disposition: A | Discharge status: Home / Self Care | Payer: Self-pay | Source: Home / Self Care | Attending: Surgery

## 2021-06-05 ENCOUNTER — Ambulatory Visit (HOSPITAL_BASED_OUTPATIENT_CLINIC_OR_DEPARTMENT_OTHER): Payer: BC Managed Care – PPO | Admitting: Anesthesiology

## 2021-06-05 ENCOUNTER — Encounter (HOSPITAL_BASED_OUTPATIENT_CLINIC_OR_DEPARTMENT_OTHER): Payer: Self-pay | Admitting: Surgery

## 2021-06-05 ENCOUNTER — Encounter: Payer: Self-pay | Admitting: *Deleted

## 2021-06-05 DIAGNOSIS — F17210 Nicotine dependence, cigarettes, uncomplicated: Secondary | ICD-10-CM | POA: Insufficient documentation

## 2021-06-05 DIAGNOSIS — J40 Bronchitis, not specified as acute or chronic: Secondary | ICD-10-CM | POA: Diagnosis not present

## 2021-06-05 DIAGNOSIS — R590 Localized enlarged lymph nodes: Secondary | ICD-10-CM | POA: Diagnosis not present

## 2021-06-05 DIAGNOSIS — E7800 Pure hypercholesterolemia, unspecified: Secondary | ICD-10-CM | POA: Diagnosis not present

## 2021-06-05 HISTORY — PX: INGUINAL LYMPH NODE BIOPSY: SHX5865

## 2021-06-05 SURGERY — BIOPSY, LYMPH NODE, INGUINAL, OPEN
Anesthesia: General | Site: Groin | Laterality: Left

## 2021-06-05 MED ORDER — LIDOCAINE 2% (20 MG/ML) 5 ML SYRINGE
INTRAMUSCULAR | Status: AC
  Filled 2021-06-05: qty 5

## 2021-06-05 MED ORDER — LIDOCAINE 2% (20 MG/ML) 5 ML SYRINGE
INTRAMUSCULAR | Status: DC | PRN
  Administered 2021-06-05: 100 mg via INTRAVENOUS

## 2021-06-05 MED ORDER — 0.9 % SODIUM CHLORIDE (POUR BTL) OPTIME
TOPICAL | Status: DC | PRN
  Administered 2021-06-05: 500 mL

## 2021-06-05 MED ORDER — FENTANYL CITRATE (PF) 100 MCG/2ML IJ SOLN
INTRAMUSCULAR | Status: DC | PRN
  Administered 2021-06-05: 50 ug via INTRAVENOUS

## 2021-06-05 MED ORDER — MIDAZOLAM HCL 5 MG/5ML IJ SOLN
INTRAMUSCULAR | Status: DC | PRN
Start: 2021-06-05 — End: 2021-06-05
  Administered 2021-06-05: 2 mg via INTRAVENOUS

## 2021-06-05 MED ORDER — ONDANSETRON HCL 4 MG/2ML IJ SOLN
INTRAMUSCULAR | Status: DC | PRN
  Administered 2021-06-05: 4 mg via INTRAVENOUS

## 2021-06-05 MED ORDER — CELECOXIB 200 MG PO CAPS
ORAL_CAPSULE | ORAL | Status: AC
  Filled 2021-06-05: qty 2

## 2021-06-05 MED ORDER — GABAPENTIN 300 MG PO CAPS
300.0000 mg | ORAL_CAPSULE | ORAL | Status: AC
  Administered 2021-06-05: 300 mg via ORAL

## 2021-06-05 MED ORDER — DEXAMETHASONE SODIUM PHOSPHATE 10 MG/ML IJ SOLN
INTRAMUSCULAR | Status: DC | PRN
  Administered 2021-06-05: 10 mg via INTRAVENOUS

## 2021-06-05 MED ORDER — GABAPENTIN 300 MG PO CAPS
ORAL_CAPSULE | ORAL | Status: AC
  Filled 2021-06-05: qty 1

## 2021-06-05 MED ORDER — OXYCODONE HCL 5 MG/5ML PO SOLN: 5.0000 mg | Freq: Once | ORAL | Status: DC | PRN

## 2021-06-05 MED ORDER — OXYCODONE HCL 5 MG PO TABS: 5.0000 mg | ORAL_TABLET | Freq: Once | ORAL | Status: DC | PRN

## 2021-06-05 MED ORDER — CELECOXIB 200 MG PO CAPS
400.0000 mg | ORAL_CAPSULE | ORAL | Status: AC
  Administered 2021-06-05: 400 mg via ORAL

## 2021-06-05 MED ORDER — OXYCODONE-ACETAMINOPHEN 5-325 MG PO TABS: 1.0000 | ORAL_TABLET | ORAL | 0 refills | Status: DC | PRN

## 2021-06-05 MED ORDER — LACTATED RINGERS IV SOLN: INTRAVENOUS | Status: DC

## 2021-06-05 MED ORDER — ONDANSETRON HCL 4 MG/2ML IJ SOLN: 4.0000 mg | Freq: Once | INTRAMUSCULAR | Status: DC | PRN

## 2021-06-05 MED ORDER — ACETAMINOPHEN 500 MG PO TABS
ORAL_TABLET | ORAL | Status: AC
  Filled 2021-06-05: qty 2

## 2021-06-05 MED ORDER — ONDANSETRON HCL 4 MG/2ML IJ SOLN
INTRAMUSCULAR | Status: AC
  Filled 2021-06-05: qty 2

## 2021-06-05 MED ORDER — DEXAMETHASONE SODIUM PHOSPHATE 10 MG/ML IJ SOLN
INTRAMUSCULAR | Status: AC
  Filled 2021-06-05: qty 1

## 2021-06-05 MED ORDER — ACETAMINOPHEN 500 MG PO TABS
1000.0000 mg | ORAL_TABLET | ORAL | Status: AC
  Administered 2021-06-05: 1000 mg via ORAL

## 2021-06-05 MED ORDER — FENTANYL CITRATE (PF) 100 MCG/2ML IJ SOLN
INTRAMUSCULAR | Status: AC
  Filled 2021-06-05: qty 2

## 2021-06-05 MED ORDER — BUPIVACAINE-EPINEPHRINE 0.25% -1:200000 IJ SOLN
INTRAMUSCULAR | Status: DC | PRN
  Administered 2021-06-05: 10 mL

## 2021-06-05 MED ORDER — CHLORHEXIDINE GLUCONATE CLOTH 2 % EX PADS: 6.0000 | MEDICATED_PAD | Freq: Once | CUTANEOUS | Status: DC

## 2021-06-05 MED ORDER — FENTANYL CITRATE (PF) 100 MCG/2ML IJ SOLN: 25.0000 ug | INTRAMUSCULAR | Status: DC | PRN

## 2021-06-05 MED ORDER — PROPOFOL 10 MG/ML IV BOLUS
INTRAVENOUS | Status: DC | PRN
  Administered 2021-06-05: 200 mg via INTRAVENOUS

## 2021-06-05 MED ORDER — CEFAZOLIN SODIUM-DEXTROSE 2-4 GM/100ML-% IV SOLN
2.0000 g | INTRAVENOUS | Status: AC
  Administered 2021-06-05: 2 g via INTRAVENOUS

## 2021-06-05 MED ORDER — MIDAZOLAM HCL 2 MG/2ML IJ SOLN
INTRAMUSCULAR | Status: AC
  Filled 2021-06-05: qty 2

## 2021-06-05 MED ORDER — CEFAZOLIN SODIUM-DEXTROSE 2-4 GM/100ML-% IV SOLN
INTRAVENOUS | Status: AC
  Filled 2021-06-05: qty 100

## 2021-06-05 SURGICAL SUPPLY — 45 items
ADH SKN CLS APL DERMABOND .7 (GAUZE/BANDAGES/DRESSINGS) ×1
APL PRP STRL LF DISP 70% ISPRP (MISCELLANEOUS)
BLADE SURG 15 STRL LF DISP TIS (BLADE) ×1 IMPLANT
BLADE SURG 15 STRL SS (BLADE) ×2
CHLORAPREP W/TINT 26 (MISCELLANEOUS) IMPLANT
CNTNR URN SCR LID CUP LEK RST (MISCELLANEOUS) ×1 IMPLANT
CONT SPEC 4OZ STRL OR WHT (MISCELLANEOUS) ×2
COVER BACK TABLE 60X90IN (DRAPES) ×2 IMPLANT
COVER MAYO STAND STRL (DRAPES) ×2 IMPLANT
COVER SURGICAL LIGHT HANDLE (MISCELLANEOUS) ×2 IMPLANT
DERMABOND ADVANCED (GAUZE/BANDAGES/DRESSINGS) ×1
DERMABOND ADVANCED .7 DNX12 (GAUZE/BANDAGES/DRESSINGS) ×1 IMPLANT
DRAPE LAPAROTOMY 100X72 PEDS (DRAPES) IMPLANT
DRAPE UTILITY XL STRL (DRAPES) ×2 IMPLANT
ELECT REM PT RETURN 9FT ADLT (ELECTROSURGICAL) ×2
ELECTRODE REM PT RTRN 9FT ADLT (ELECTROSURGICAL) ×1 IMPLANT
GAUZE 4X4 16PLY ~~LOC~~+RFID DBL (SPONGE) ×2 IMPLANT
GAUZE SPONGE 4X4 12PLY STRL (GAUZE/BANDAGES/DRESSINGS) ×2 IMPLANT
GLOVE SRG 8 PF TXTR STRL LF DI (GLOVE) ×1 IMPLANT
GLOVE SURG PR MICRO ENCORE 7.5 (GLOVE) ×2 IMPLANT
GLOVE SURG UNDER POLY LF SZ8 (GLOVE) ×2
GOWN STRL REUS W/TWL LRG LVL3 (GOWN DISPOSABLE) ×2 IMPLANT
KIT TURNOVER CYSTO (KITS) ×2 IMPLANT
NEEDLE HYPO 22GX1.5 SAFETY (NEEDLE) IMPLANT
NEEDLE HYPO 25X1 1.5 SAFETY (NEEDLE) IMPLANT
NS IRRIG 500ML POUR BTL (IV SOLUTION) ×2 IMPLANT
PACK BASIN DAY SURGERY FS (CUSTOM PROCEDURE TRAY) ×2 IMPLANT
PENCIL SMOKE EVACUATOR (MISCELLANEOUS) ×2 IMPLANT
SPONGE T-LAP 18X18 ~~LOC~~+RFID (SPONGE) IMPLANT
SPONGE T-LAP 4X18 ~~LOC~~+RFID (SPONGE) IMPLANT
SUCTION FRAZIER HANDLE 10FR (MISCELLANEOUS)
SUCTION TUBE FRAZIER 10FR DISP (MISCELLANEOUS) IMPLANT
SUT CHROMIC 3 0 SH 27 (SUTURE) IMPLANT
SUT ETHILON 2 0 FS 18 (SUTURE) IMPLANT
SUT MNCRL AB 4-0 PS2 18 (SUTURE) ×2 IMPLANT
SUT VIC AB 2-0 SH 27 (SUTURE)
SUT VIC AB 2-0 SH 27XBRD (SUTURE) IMPLANT
SUT VIC AB 3-0 SH 18 (SUTURE) ×2 IMPLANT
SUT VIC AB 3-0 SH 27 (SUTURE) ×2
SUT VIC AB 3-0 SH 27X BRD (SUTURE) ×1 IMPLANT
SYR BULB IRRIG 60ML STRL (SYRINGE) ×2 IMPLANT
SYR CONTROL 10ML LL (SYRINGE) ×2 IMPLANT
TOWEL OR 17X26 10 PK STRL BLUE (TOWEL DISPOSABLE) ×2 IMPLANT
TUBE CONNECTING 12X1/4 (SUCTIONS) ×2 IMPLANT
YANKAUER SUCT BULB TIP NO VENT (SUCTIONS) ×2 IMPLANT

## 2021-06-05 NOTE — Transfer of Care (Signed)
Immediate Anesthesia Transfer of Care Note  Patient: Angie Roman  Procedure(s) Performed: LEFT INGUINAL LYMPH NODE BIOPSY (Left: Groin)  Patient Location: PACU  Anesthesia Type:General  Level of Consciousness: awake, alert  and oriented  Airway & Oxygen Therapy: Patient Spontanous Breathing and Patient connected to nasal cannula oxygen  Post-op Assessment: Report given to RN  Post vital signs: Reviewed and stable  Last Vitals:  Vitals Value Taken Time  BP 141/85   Temp    Pulse 93 06/05/21 1633  Resp 25 06/05/21 1633  SpO2 100 % 06/05/21 1633  Vitals shown include unvalidated device data.  Last Pain:  Vitals:   06/05/21 1324  TempSrc: Oral  PainSc: 0-No pain      Patients Stated Pain Goal: 8 (06/05/21 1324)  Complications: No notable events documented.

## 2021-06-05 NOTE — Progress Notes (Signed)
Patient at [facility] for excisional LN biopsy. Will follow for path.  Oncology Nurse Navigator Documentation  Oncology Nurse Navigator Flowsheets 06/05/2021  Abnormal Finding Date -  Diagnosis Status -  Navigator Follow Up Date: 06/09/2021  Navigator Follow Up Reason: Pathology  Navigator Location [facility]  Referral Date to RadOnc/MedOnc -  Navigator Encounter Type Appt/Treatment Plan Review  Telephone -  Patient Visit Type MedOnc  Treatment Phase Abnormal Scans  Barriers/Navigation Needs Coordination of Care;Education  Education -  Interventions None Required  Acuity Level 2-Minimal Needs (1-2 Barriers Identified)  Referrals -  Coordination of Care -  Education Method -  Support Groups/Services Friends and Family  Time Spent with Patient 15

## 2021-06-05 NOTE — Discharge Instructions (Signed)
No acetaminophen/Tylenol until after 7:15pm today if needed for pain.  No ibuprofen, Advil, Aleve, Motrin, ketorolac, meloxicam, naproxen, or other NSAIDS until after 7:15pm today if needed for pain.     Post Anesthesia Home Care Instructions  Activity: Get plenty of rest for the remainder of the day. A responsible individual must stay with you for 24 hours following the procedure.  For the next 24 hours, DO NOT: -Drive a car -Advertising copywriter -Drink alcoholic beverages -Take any medication unless instructed by your physician -Make any legal decisions or sign important papers.  Meals: Start with liquid foods such as gelatin or soup. Progress to regular foods as tolerated. Avoid greasy, spicy, heavy foods. If nausea and/or vomiting occur, drink only clear liquids until the nausea and/or vomiting subsides. Call your physician if vomiting continues.  Special Instructions/Symptoms: Your throat may feel dry or sore from the anesthesia or the breathing tube placed in your throat during surgery. If this causes discomfort, gargle with warm salt water. The discomfort should disappear within 24 hours.

## 2021-06-05 NOTE — Anesthesia Preprocedure Evaluation (Signed)
Anesthesia Evaluation  Patient identified by MRN, date of birth, ID band Patient awake    Reviewed: Allergy & Precautions, H&P , NPO status , Patient's Chart, lab work & pertinent test results  Airway Mallampati: II  TM Distance: >3 FB Neck ROM: Full    Dental no notable dental hx.    Pulmonary Current Smoker,    Pulmonary exam normal breath sounds clear to auscultation       Cardiovascular negative cardio ROS Normal cardiovascular exam Rhythm:Regular Rate:Normal     Neuro/Psych negative neurological ROS  negative psych ROS   GI/Hepatic negative GI ROS, Neg liver ROS,   Endo/Other  negative endocrine ROS  Renal/GU negative Renal ROS  negative genitourinary   Musculoskeletal negative musculoskeletal ROS (+)   Abdominal   Peds negative pediatric ROS (+)  Hematology negative hematology ROS (+)   Anesthesia Other Findings   Reproductive/Obstetrics negative OB ROS                             Anesthesia Physical Anesthesia Plan  ASA: 2  Anesthesia Plan: General   Post-op Pain Management:    Induction: Intravenous  PONV Risk Score and Plan: Ondansetron, Dexamethasone and Treatment may vary due to age or medical condition  Airway Management Planned: LMA  Additional Equipment:   Intra-op Plan:   Post-operative Plan: Extubation in OR  Informed Consent: I have reviewed the patients History and Physical, chart, labs and discussed the procedure including the risks, benefits and alternatives for the proposed anesthesia with the patient or authorized representative who has indicated his/her understanding and acceptance.     Dental advisory given  Plan Discussed with: CRNA and Surgeon  Anesthesia Plan Comments:         Anesthesia Quick Evaluation

## 2021-06-05 NOTE — Op Note (Signed)
   Patient: Angie Roman ([birth year], [id number])  Date of Surgery: 06/05/2021   Preoperative Diagnosis: LEFT INGUINAL LYMPHADENOPATHY   Postoperative Diagnosis: LEFT INGUINAL LYMPHADENOPATHY   Surgical Procedure: LEFT INGUINAL LYMPH NODE BIOPSY: 25053 (CPT)   Operative Team Members:  Surgeon(s) and Role:    * Brylee Berk, Hyman Hopes, MD - Primary   Anesthesiologist: Eilene Ghazi, MD CRNA: Briant Sites, CRNA   Anesthesia: General   Fluids:  Total I/O In: 100 [IV Piggyback:100] Out: -   Complications: None  Drains:  none   Specimen:  ID Type Source Tests Collected by Time Destination  1 : Left inguinal lymph node Tissue PATH Lymph node biopsy SURGICAL PATHOLOGY Saharah Sherrow, Hyman Hopes, MD 06/05/2021 1510      Disposition:  PACU - hemodynamically stable.  Plan of Care: Discharge to home after PACU    Indications for Procedure: Angie Roman is a 65 y.o. female who presented with inguinal lymphadenopathy.  The procedure itself as well as its risks, benefits and alternatives were discussed.  The risks discussed included but were not limited to the risk of infection, bleeding, damage to nearby structures, and wound complication.  After a full discussion and all questions answered the patient granted consent to proceed.  Findings: left inguinal lymphadenopathy   Description of Procedure:   On the date stated above the patient was taken operating suite and placed in supine position.  General LMA anesthesia was induced.  A timeout was completed verifying the correct patient, procedure, positioning, and equipment needed for the case.  The patient's left groin was prepped and draped in usual sterile fashion.  I made an incision just above the inguinal ligament and above her skin folds and dissected down to the fascia of the anterior abdominal wall using electrocautery and blunt dissection to work through the subcutaneous layers including Scarpa's layer.  The fascia of the  inguinal ligament was identified I worked inferiorly to palpate for the lymphadenopathy in the left groin.  A large lymph node was identified and dissected out circumferentially using blunt dissection electrocautery for hemostasis.  The lymph node was passed off the field as a specimen.  The wound was irrigated and then closed in multiple layers with Vicryl suture to close Scarpa's layer, Vicryl suture to close the deep dermal layer and Monocryl and Dermabond to close the skin.  All sponge needle counts were correct at the end of the case.  The specimen was sent fresh to pathology.  At the end of the case we reviewed the infection status of the case. Patient: Private Patient Elective Case Case: Elective Infection Present At Time Of Surgery (PATOS): None  Ivar Drape, MD General, Bariatric, & Minimally Invasive Surgery [facility], [place]

## 2021-06-05 NOTE — Anesthesia Postprocedure Evaluation (Signed)
Anesthesia Post Note  Patient: Angie Roman  Procedure(s) Performed: LEFT INGUINAL LYMPH NODE BIOPSY (Left: Groin)     Patient location during evaluation: PACU Anesthesia Type: General Level of consciousness: awake and alert Pain management: pain level controlled Vital Signs Assessment: post-procedure vital signs reviewed and stable Respiratory status: spontaneous breathing, nonlabored ventilation, respiratory function stable and patient connected to nasal cannula oxygen Cardiovascular status: blood pressure returned to baseline and stable Postop Assessment: no apparent nausea or vomiting Anesthetic complications: no   No notable events documented.  Last Vitals:  Vitals:   06/05/21 1700 06/05/21 1725  BP: (!) 159/81 (!) 173/88  Pulse: 67 70  Resp: 20 17  Temp: (!) 36.3 C 36.4 C  SpO2: 96% 100%    Last Pain:  Vitals:   06/05/21 1725  TempSrc:   PainSc: 2                  Trevor Iha

## 2021-06-05 NOTE — Anesthesia Procedure Notes (Signed)
Procedure Name: LMA Insertion Date/Time: 06/05/2021 3:48 PM Performed by: Briant Sites, CRNA Pre-anesthesia Checklist: Patient identified, Emergency Drugs available, Suction available and Patient being monitored Patient Re-evaluated:Patient Re-evaluated prior to induction Oxygen Delivery Method: Circle system utilized Preoxygenation: Pre-oxygenation with 100% oxygen Induction Type: IV induction Ventilation: Mask ventilation without difficulty LMA: LMA inserted LMA Size: 4.0 Number of attempts: 1 Airway Equipment and Method: Bite block Placement Confirmation: positive ETCO2 Tube secured with: Tape Dental Injury: Teeth and Oropharynx as per pre-operative assessment

## 2021-06-05 NOTE — H&P (Signed)
   Admitting Physician: Hyman Hopes Brantlee Penn  Service: General surgery  CC:   Subjective   HPI: Angie Roman is an 65 y.o. female who is here for left inguinal lymph node biopsy  Past Medical History:  Diagnosis Date   Bronchitis    High cholesterol     Past Surgical History:  Procedure Laterality Date   COLONOSCOPY WITH ESOPHAGOGASTRODUODENOSCOPY (EGD)  04/21/2021    History reviewed. No pertinent family history.  Social:  reports that she has been smoking cigarettes. She has been smoking an average of .5 packs per day. She has never used smokeless tobacco. She reports current alcohol use. She reports that she does not use drugs.  Allergies:  Allergies  Allergen Reactions   Iodinated Diagnostic Agents Rash    Medications: Current Outpatient Medications  Medication Instructions   cyclobenzaprine (FLEXERIL) 5-10 mg, Oral, Every 8 hours PRN    ROS - all of the below systems have been reviewed with the patient and positives are indicated with bold text General: chills, fever or night sweats Eyes: blurry vision or double vision ENT: epistaxis or sore throat Allergy/Immunology: itchy/watery eyes or nasal congestion Hematologic/Lymphatic: bleeding problems, blood clots or swollen lymph nodes Endocrine: temperature intolerance or unexpected weight changes Breast: new or changing breast lumps or nipple discharge Resp: cough, shortness of breath, or wheezing CV: chest pain or dyspnea on exertion GI: as per HPI GU: dysuria, trouble voiding, or hematuria MSK: joint pain or joint stiffness Neuro: TIA or stroke symptoms Derm: pruritus and skin lesion changes Psych: anxiety and depression  Objective   PE Blood pressure (!) 188/85, pulse 90, temperature 98.8 F (37.1 C), temperature source Oral, resp. rate 15, height 5\' 5"  (1.651 m), weight 91.2 kg, SpO2 99 %. Constitutional: NAD; conversant; no deformities Eyes: Moist conjunctiva; no lid lag; anicteric; PERRL Neck:  Trachea midline; no thyromegaly Lungs: Normal respiratory effort; no tactile fremitus CV: RRR; no palpable thrills; no pitting edema GI: Abd left inguinal adenopathy palpable; no palpable hepatosplenomegaly MSK: Normal range of motion of extremities; no clubbing/cyanosis Psychiatric: Appropriate affect; alert and oriented x3 Lymphatic: No palpable cervical or axillary lymphadenopathy  No results found for this or any previous visit (from the past 24 hour(s)).  Imaging Orders  No imaging studies ordered today     Assessment and Plan   Angie Roman is an 65 y.o. female with lymphadenopathy here for left inguinal lymph node biopsy.  The procedure itself as well as its risks, benefits and alternatives were discussed and the patient granted consent to proceed.  We will proceed as scheduled.  76, MD  [facility] Use [email or web address] to contact on call provider

## 2021-06-08 ENCOUNTER — Encounter (HOSPITAL_BASED_OUTPATIENT_CLINIC_OR_DEPARTMENT_OTHER): Payer: Self-pay | Admitting: Surgery

## 2021-06-12 LAB — SURGICAL PATHOLOGY

## 2021-06-15 ENCOUNTER — Encounter: Payer: Self-pay | Admitting: *Deleted

## 2021-06-15 NOTE — Progress Notes (Signed)
Reviewed path with Dr Myna Hidalgo. No cancer diagnosis at this time. We will follow up with patient in 4-6 weeks. Message sent to scheduler.  Patient called with results.  Oncology Nurse Navigator Documentation  Oncology Nurse Navigator Flowsheets 06/15/2021  Abnormal Finding Date -  Diagnosis Status -  Navigator Follow Up Date: -  Navigator Follow Up Reason: -  Navigation Complete Date: 06/15/2021  Post Navigation: Continue to Follow Patient? No  Reason Not Navigating Patient: No Cancer Diagnosis  Navigator Location [facility]  Referral Date to RadOnc/MedOnc -  Navigator Encounter Type Pathology Review;Diagnostic Results;Telephone  Telephone Diagnostic Results;Outgoing Call  Patient Visit Type MedOnc  Treatment Phase Abnormal Scans  Barriers/Navigation Needs Coordination of Care;Education  Education Other  Interventions Education;Psycho-Social Support  Acuity Level 2-Minimal Needs (1-2 Barriers Identified)  Referrals -  Coordination of Care Appts  Education Method Verbal  Support Groups/Services Friends and Family  Time Spent with Patient 30

## 2021-06-16 ENCOUNTER — Telehealth: Payer: Self-pay | Admitting: *Deleted

## 2021-06-16 NOTE — Telephone Encounter (Signed)
Per scheduling message Asher Muir - called and gave upcoming appointments - confirmed

## 2021-07-15 ENCOUNTER — Inpatient Hospital Stay (HOSPITAL_BASED_OUTPATIENT_CLINIC_OR_DEPARTMENT_OTHER): Payer: BC Managed Care – PPO | Admitting: Hematology & Oncology

## 2021-07-15 ENCOUNTER — Encounter: Payer: Self-pay | Admitting: Hematology & Oncology

## 2021-07-15 ENCOUNTER — Encounter: Payer: Self-pay | Admitting: *Deleted

## 2021-07-15 ENCOUNTER — Inpatient Hospital Stay: Payer: BC Managed Care – PPO | Attending: Hematology & Oncology

## 2021-07-15 ENCOUNTER — Other Ambulatory Visit: Payer: Self-pay

## 2021-07-15 VITALS — BP 158/97 | HR 77 | Temp 98.3°F | Resp 18 | Wt 200.0 lb

## 2021-07-15 DIAGNOSIS — Z803 Family history of malignant neoplasm of breast: Secondary | ICD-10-CM | POA: Diagnosis not present

## 2021-07-15 DIAGNOSIS — R590 Localized enlarged lymph nodes: Secondary | ICD-10-CM | POA: Diagnosis not present

## 2021-07-15 DIAGNOSIS — R1900 Intra-abdominal and pelvic swelling, mass and lump, unspecified site: Secondary | ICD-10-CM

## 2021-07-15 DIAGNOSIS — F17210 Nicotine dependence, cigarettes, uncomplicated: Secondary | ICD-10-CM | POA: Insufficient documentation

## 2021-07-15 LAB — CMP (CANCER CENTER ONLY)
ALT: 13 U/L (ref 0–44)
AST: 13 U/L — ABNORMAL LOW (ref 15–41)
Albumin: 4.1 g/dL (ref 3.5–5.0)
Alkaline Phosphatase: 88 U/L (ref 38–126)
Anion gap: 8 (ref 5–15)
BUN: 12 mg/dL (ref 8–23)
CO2: 26 mmol/L (ref 22–32)
Calcium: 9.4 mg/dL (ref 8.9–10.3)
Chloride: 105 mmol/L (ref 98–111)
Creatinine: 0.6 mg/dL (ref 0.44–1.00)
GFR, Estimated: >60 mL/min (ref >60)
Glucose, Bld: 98 mg/dL (ref 70–99)
Potassium: 3.6 mmol/L (ref 3.5–5.1)
Sodium: 139 mmol/L (ref 135–145)
Total Bilirubin: 0.3 mg/dL (ref 0.3–1.2)
Total Protein: 6.9 g/dL (ref 6.5–8.1)

## 2021-07-15 LAB — CBC WITH DIFFERENTIAL (CANCER CENTER ONLY)
Abs Immature Granulocytes: 0.03 10*3/uL (ref 0.00–0.07)
Basophils Absolute: 0 10*3/uL (ref 0.0–0.1)
Basophils Relative: 1 %
Eosinophils Absolute: 0.1 10*3/uL (ref 0.0–0.5)
Eosinophils Relative: 2 %
HCT: 39.1 % (ref 36.0–46.0)
Hemoglobin: 12.9 g/dL (ref 12.0–15.0)
Immature Granulocytes: 1 %
Lymphocytes Relative: 45 %
Lymphs Abs: 2.9 10*3/uL (ref 0.7–4.0)
MCH: 28.6 pg (ref 26.0–34.0)
MCHC: 33 g/dL (ref 30.0–36.0)
MCV: 86.7 fL (ref 80.0–100.0)
Monocytes Absolute: 0.5 10*3/uL (ref 0.1–1.0)
Monocytes Relative: 8 %
Neutro Abs: 2.7 10*3/uL (ref 1.7–7.7)
Neutrophils Relative %: 43 %
Platelet Count: 241 10*3/uL (ref 150–400)
RBC: 4.51 MIL/uL (ref 3.87–5.11)
RDW: 12.4 % (ref 11.5–15.5)
WBC Count: 6.3 10*3/uL (ref 4.0–10.5)
nRBC: 0 % (ref 0.0–0.2)

## 2021-07-15 LAB — RETICULOCYTES
Immature Retic Fract: 5 % (ref 2.3–15.9)
RBC.: 4.43 MIL/uL (ref 3.87–5.11)
Retic Count, Absolute: 68.2 10*3/uL (ref 19.0–186.0)
Retic Ct Pct: 1.5 % (ref 0.4–3.1)

## 2021-07-15 LAB — LACTATE DEHYDROGENASE: LDH: 177 U/L (ref 98–192)

## 2021-07-15 NOTE — Progress Notes (Signed)
Oncology Nurse Navigator Documentation  Oncology Nurse Navigator Flowsheets 07/15/2021  Abnormal Finding Date -  Diagnosis Status -  Navigator Follow Up Date: -  Navigator Follow Up Reason: -  Navigation Complete Date: 07/15/2021  Post Navigation: Continue to Follow Patient? No  Reason Not Navigating Patient: No Cancer Diagnosis  Navigator Location [facility]  Referral Date to RadOnc/MedOnc -  Navigator Encounter Type Follow-up Appt  Telephone -  Patient Visit Type MedOnc  Treatment Phase Abnormal Scans  Barriers/Navigation Needs Coordination of Care;Education  Education -  Interventions -  Acuity Level 2-Minimal Needs (1-2 Barriers Identified)  Referrals -  Coordination of Care -  Education Method -  Support Groups/Services Friends and Family  Time Spent with Patient 15

## 2021-07-15 NOTE — Progress Notes (Signed)
Hematology and Oncology Follow Up Visit  Angie Roman [id number] 1956-04-23 65 y.o. 07/15/2021   Principle Diagnosis:  Florid lymphoid hyperplasia  Current Therapy:   Observation     Interim History:  Angie Roman is coming for second office visit.  We first saw her back in October.  At that time, there was some concern as to be she may have had a lymphoproliferative process.  We did do a PET scan on her.  This was done on 05/11/2021.  This did show activity in the pelvic and inguinal lymph nodes.  The SUV was highest in a left inguinal node measured 11.33.  Based on this, we then went ahead and did a biopsy.  This was done on 06/05/2021.  This was an excisional biopsy.  The pathology report ([id number]) showed a florid reactive lymphoid hyperplasia.  There is no monoclonal population of cells.  As such, I cannot find any malignancy.  We will just have to watch this..  She feels well.  She has had no problems over the holiday season.  There is been no abdominal pain.  She has had no problems with nausea or vomiting.  She has had no cough or shortness of breath.  There has been no bleeding.  She has had no fever.  She has had no rash.  Overall, I would say that her performance status is ECOG 0.  Medications:  Current Outpatient Medications:    cyclobenzaprine (FLEXERIL) 10 MG tablet, Take 5-10 mg by mouth every 8 (eight) hours as needed., Disp: , Rfl:    oxyCODONE-acetaminophen (PERCOCET) 5-325 MG tablet, Take 1 tablet by mouth every 4 (four) hours as needed for severe pain., Disp: 10 tablet, Rfl: 0  Allergies:  Allergies  Allergen Reactions   Iodinated Contrast Media Rash    Past Medical History, Surgical history, Social history, and Family History were reviewed and updated.  Review of Systems: Review of Systems  Constitutional: Negative.   HENT:  Negative.    Eyes: Negative.   Respiratory: Negative.    Cardiovascular: Negative.   Gastrointestinal: Negative.   Endocrine:  Negative.   Genitourinary: Negative.    Musculoskeletal: Negative.   Skin: Negative.   Neurological: Negative.   Hematological: Negative.   Psychiatric/Behavioral: Negative.     Physical Exam:  weight is 200 lb (90.7 kg). Her oral temperature is 98.3 F (36.8 C). Her blood pressure is 158/97 (abnormal) and her pulse is 77. Her respiration is 18 and oxygen saturation is 100%.   Wt Readings from Last 3 Encounters:  07/15/21 200 lb (90.7 kg)  06/05/21 201 lb 1.6 oz (91.2 kg)  04/27/21 206 lb (93.4 kg)    Physical Exam Vitals reviewed.  HENT:     Head: Normocephalic and atraumatic.  Eyes:     Pupils: Pupils are equal, round, and reactive to light.  Cardiovascular:     Rate and Rhythm: Normal rate and regular rhythm.     Heart sounds: Normal heart sounds.  Pulmonary:     Effort: Pulmonary effort is normal.     Breath sounds: Normal breath sounds.  Abdominal:     General: Bowel sounds are normal.     Palpations: Abdomen is soft.  Musculoskeletal:        General: No tenderness or deformity. Normal range of motion.     Cervical back: Normal range of motion.  Lymphadenopathy:     Cervical: No cervical adenopathy.  Skin:    General: Skin is warm and dry.  Findings: No erythema or rash.  Neurological:     Mental Status: She is alert and oriented to person, place, and time.  Psychiatric:        Behavior: Behavior normal.        Thought Content: Thought content normal.        Judgment: Judgment normal.     Lab Results  Component Value Date   WBC 6.3 07/15/2021   HGB 12.9 07/15/2021   HCT 39.1 07/15/2021   MCV 86.7 07/15/2021   PLT 241 07/15/2021     Chemistry      Component Value Date/Time   NA 139 07/15/2021 1446   K 3.6 07/15/2021 1446   CL 105 07/15/2021 1446   CO2 26 07/15/2021 1446   BUN 12 07/15/2021 1446   CREATININE 0.60 07/15/2021 1446      Component Value Date/Time   CALCIUM 9.4 07/15/2021 1446   ALKPHOS 88 07/15/2021 1446   AST 13 (L) 07/15/2021  1446   ALT 13 07/15/2021 1446   BILITOT 0.3 07/15/2021 1446       Impression and Plan: Angie Roman is a very nice 64 year old African-American female.  She has florid reactive lymphoid hyperplasia.  There is no malignant lymphoproliferative process.  I am unsure if she is at high risk for a malignant process.  I have noted that on her CBC, there is a increase in her lymphocytes that is slow but steady.  We are going to have to watch this.  If we see that the lymphocytes continue to increase, we may have to consider a bone marrow biopsy on her.  I would think this would be last resort given that this would be invasive.  Again, I am not sure why she would have reactive lymphoid hyperplasia.  I do think that a follow-up PET scan would be reasonable.  I would do 1 in March.  We will see if there is any change in the lymph nodes.  I would hate to have to go after another lymph node surgically.  However, this might be necessary.  It was fun talking with Angie Roman.  She is very nice.   Volanda Napoleon, MD 12/28/20223:39 PM

## 2021-07-16 LAB — FERRITIN: Ferritin: 81 ng/mL (ref 11–307)

## 2021-07-16 LAB — BETA 2 MICROGLOBULIN, SERUM: Beta-2 Microglobulin: 1.1 mg/L (ref 0.6–2.4)

## 2021-07-16 LAB — CEA (IN HOUSE-CHCC): CEA (CHCC-In House): 2.02 ng/mL (ref 0.00–5.00)

## 2021-07-16 LAB — CA 125: Cancer Antigen (CA) 125: <2 U/mL (ref 0.0–38.1)

## 2021-09-25 ENCOUNTER — Ambulatory Visit (HOSPITAL_COMMUNITY)
Admission: RE | Admit: 2021-09-25 | Discharge: 2021-09-25 | Disposition: A | Discharge status: Home / Self Care | Payer: BC Managed Care – PPO | Source: Ambulatory Visit | Attending: Hematology & Oncology | Admitting: Hematology & Oncology

## 2021-09-25 ENCOUNTER — Other Ambulatory Visit: Payer: Self-pay

## 2021-09-25 DIAGNOSIS — R1900 Intra-abdominal and pelvic swelling, mass and lump, unspecified site: Secondary | ICD-10-CM | POA: Insufficient documentation

## 2021-09-25 DIAGNOSIS — I700 Atherosclerosis of aorta: Secondary | ICD-10-CM | POA: Diagnosis not present

## 2021-09-25 DIAGNOSIS — I6522 Occlusion and stenosis of left carotid artery: Secondary | ICD-10-CM | POA: Diagnosis not present

## 2021-09-25 LAB — GLUCOSE, CAPILLARY: Glucose-Capillary: 92 mg/dL (ref 70–99)

## 2021-09-25 MED ORDER — FLUDEOXYGLUCOSE F - 18 (FDG) INJECTION
10.0000 | Freq: Once | INTRAVENOUS | Status: AC
  Administered 2021-09-25: 9.9 via INTRAVENOUS

## 2021-09-28 ENCOUNTER — Telehealth: Payer: Self-pay

## 2021-09-28 NOTE — Telephone Encounter (Signed)
Called and LM informing patient of results, requested a CB with any questions or concerns.  ° °

## 2021-09-28 NOTE — Telephone Encounter (Signed)
-----   Message from Josph Macho, MD sent at 09/28/2021  2:14 PM EDT ----- ?Call - the PETscan shows that the lymph nodes are about the same size with the same activity!!  This is good!!  Consulting civil engineer ?

## 2021-10-09 ENCOUNTER — Inpatient Hospital Stay (HOSPITAL_BASED_OUTPATIENT_CLINIC_OR_DEPARTMENT_OTHER): Payer: BC Managed Care – PPO | Admitting: Hematology & Oncology

## 2021-10-09 ENCOUNTER — Other Ambulatory Visit: Payer: Self-pay

## 2021-10-09 ENCOUNTER — Inpatient Hospital Stay: Payer: BC Managed Care – PPO | Attending: Hematology & Oncology

## 2021-10-09 ENCOUNTER — Encounter: Payer: Self-pay | Admitting: Hematology & Oncology

## 2021-10-09 VITALS — BP 176/74 | HR 81 | Temp 98.0°F | Resp 16 | Wt 201.0 lb

## 2021-10-09 DIAGNOSIS — R590 Localized enlarged lymph nodes: Secondary | ICD-10-CM | POA: Diagnosis not present

## 2021-10-09 DIAGNOSIS — Z803 Family history of malignant neoplasm of breast: Secondary | ICD-10-CM | POA: Insufficient documentation

## 2021-10-09 DIAGNOSIS — F17210 Nicotine dependence, cigarettes, uncomplicated: Secondary | ICD-10-CM | POA: Insufficient documentation

## 2021-10-09 LAB — CMP (CANCER CENTER ONLY)
ALT: 12 U/L (ref 0–44)
AST: 13 U/L — ABNORMAL LOW (ref 15–41)
Albumin: 4.3 g/dL (ref 3.5–5.0)
Alkaline Phosphatase: 81 U/L (ref 38–126)
Anion gap: 5 (ref 5–15)
BUN: 12 mg/dL (ref 8–23)
CO2: 29 mmol/L (ref 22–32)
Calcium: 9.7 mg/dL (ref 8.9–10.3)
Chloride: 104 mmol/L (ref 98–111)
Creatinine: 0.6 mg/dL (ref 0.44–1.00)
GFR, Estimated: >60 mL/min (ref >60)
Glucose, Bld: 128 mg/dL — ABNORMAL HIGH (ref 70–99)
Potassium: 3.9 mmol/L (ref 3.5–5.1)
Sodium: 138 mmol/L (ref 135–145)
Total Bilirubin: 0.3 mg/dL (ref 0.3–1.2)
Total Protein: 7.3 g/dL (ref 6.5–8.1)

## 2021-10-09 LAB — CBC WITH DIFFERENTIAL (CANCER CENTER ONLY)
Abs Immature Granulocytes: 0.02 10*3/uL (ref 0.00–0.07)
Basophils Absolute: 0 10*3/uL (ref 0.0–0.1)
Basophils Relative: 0 %
Eosinophils Absolute: 0 10*3/uL (ref 0.0–0.5)
Eosinophils Relative: 1 %
HCT: 39.9 % (ref 36.0–46.0)
Hemoglobin: 13.2 g/dL (ref 12.0–15.0)
Immature Granulocytes: 0 %
Lymphocytes Relative: 41 %
Lymphs Abs: 3 10*3/uL (ref 0.7–4.0)
MCH: 29 pg (ref 26.0–34.0)
MCHC: 33.1 g/dL (ref 30.0–36.0)
MCV: 87.7 fL (ref 80.0–100.0)
Monocytes Absolute: 0.4 10*3/uL (ref 0.1–1.0)
Monocytes Relative: 6 %
Neutro Abs: 3.8 10*3/uL (ref 1.7–7.7)
Neutrophils Relative %: 52 %
Platelet Count: 239 10*3/uL (ref 150–400)
RBC: 4.55 MIL/uL (ref 3.87–5.11)
RDW: 12.5 % (ref 11.5–15.5)
WBC Count: 7.3 10*3/uL (ref 4.0–10.5)
nRBC: 0 % (ref 0.0–0.2)

## 2021-10-09 LAB — SAVE SMEAR(SSMR), FOR PROVIDER SLIDE REVIEW

## 2021-10-09 LAB — LACTATE DEHYDROGENASE: LDH: 167 U/L (ref 98–192)

## 2021-10-09 MED ORDER — TELMISARTAN 40 MG PO TABS: 40.0000 mg | ORAL_TABLET | Freq: Every day | ORAL | 4 refills | Status: DC

## 2021-10-09 NOTE — Progress Notes (Signed)
?Hematology and Oncology Follow Up Visit ? ?Labrea Eccleston ?[id number] ?Jan 06, 1956 66 y.o. ?10/09/2021 ? ? 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 ?Current Therapy:   ?Observation ?    ?Interim History:  Ms. Pettibone is coming for follow-up.  We last saw her back in December. ? ?We did do a follow-up PET scan on her.  This was done on 09/25/2021.  There was really no change in her lymph nodes.  There was some stable activity in some of the pelvic lymph nodes.  There is no growth.  Nothing suggested that there was any lymphoma. ? ?She feels okay.  Her blood pressure is on the high side.  She has no family doctor.  We will have to see about making her an appointment with one of the wonderful doctors downstairs. ? ?I am going to go ahead and put her on Micardis to see to try to help with her blood pressure. ? ?She has had no headache.  She has had no problems with nausea or vomiting.  There is been no change in bowel or bladder habits.  She has had no rashes.  There is been no leg swelling.  She has had no problems with COVID. ? ?Overall, her performance status is ECOG 1.   ? ? ?Medications: No current outpatient medications on file. ? ?Allergies:  ?Allergies  ?Allergen Reactions  ? Iodinated Contrast Media Rash  ? ? ?Past Medical History, Surgical history, Social history, and Family History were reviewed and updated. ? ?Review of Systems: ?Review of Systems  ?Constitutional: Negative.   ?HENT:  Negative.    ?Eyes: Negative.   ?Respiratory: Negative.    ?Cardiovascular: Negative.   ?Gastrointestinal: Negative.   ?Endocrine: Negative.   ?Genitourinary: Negative.    ?Musculoskeletal: Negative.   ?Skin: Negative.   ?Neurological: Negative.   ?Hematological: Negative.   ?Psychiatric/Behavioral: Negative.    ? ?Physical Exam: ? weight is 201 lb (91.2 kg). Her oral temperature is 98 ?F (36.7 ?C). Her blood pressure is 176/74 (abnormal) and her pulse is 81. Her respiration is 16 and oxygen saturation is 100%.  ? ?Wt  Readings from Last 3 Encounters:  ?10/09/21 201 lb (91.2 kg)  ?07/15/21 200 lb (90.7 kg)  ?06/05/21 201 lb 1.6 oz (91.2 kg)  ? ? ?Physical Exam ?Vitals reviewed.  ?HENT:  ?   Head: Normocephalic and atraumatic.  ?Eyes:  ?   Pupils: Pupils are equal, round, and reactive to light.  ?Cardiovascular:  ?   Rate and Rhythm: Normal rate and regular rhythm.  ?   Heart sounds: Normal heart sounds.  ?Pulmonary:  ?   Effort: Pulmonary effort is normal.  ?   Breath sounds: Normal breath sounds.  ?Abdominal:  ?   General: Bowel sounds are normal.  ?   Palpations: Abdomen is soft.  ?Musculoskeletal:     ?   General: No tenderness or deformity. Normal range of motion.  ?   Cervical back: Normal range of motion.  ?Lymphadenopathy:  ?   Cervical: No cervical adenopathy.  ?Skin: ?   General: Skin is warm and dry.  ?   Findings: No erythema or rash.  ?Neurological:  ?   Mental Status: She is alert and oriented to person, place, and time.  ?Psychiatric:     ?   Behavior: Behavior normal.     ?   Thought Content: Thought content normal.     ?   Judgment: Judgment normal.  ? ? ? ?Lab Results  ?  Component Value Date  ? WBC 7.3 10/09/2021  ? HGB 13.2 10/09/2021  ? HCT 39.9 10/09/2021  ? MCV 87.7 10/09/2021  ? PLT 239 10/09/2021  ? ?  Chemistry   ?   ?Component Value Date/Time  ? NA 138 10/09/2021 1425  ? K 3.9 10/09/2021 1425  ? CL 104 10/09/2021 1425  ? CO2 29 10/09/2021 1425  ? BUN 12 10/09/2021 1425  ? CREATININE 0.60 10/09/2021 1425  ?    ?Component Value Date/Time  ? CALCIUM 9.7 10/09/2021 1425  ? ALKPHOS 81 10/09/2021 1425  ? AST 13 (L) 10/09/2021 1425  ? ALT 12 10/09/2021 1425  ? BILITOT 0.3 10/09/2021 1425  ?  ? ? ? ?Impression and Plan: ?Ms. Cavenaugh is a very nice 66 year old African-American female.  She has florid reactive lymphoid hyperplasia.  There is no malignant lymphoproliferative process.  I am unsure if she is at high risk for a malignant process. ? ?I looked at her blood smear.  I really do not see anything that looked  suggestive on the blood smear.  Her lymphocytes are holding steady.  Her white cell count is holding steady. ? ?Think we can probably do a PET scan sometime in late summer.  I think this would be reasonable. ? ?If we find something that is different, then we will have to see about getting a biopsy.  I think this might have to be surgical and I would hate to have to get her to have surgery. ? ?It is always fun talking to her.  She has such a strong faith. ? ? ?Josph Macho, MD ?3/24/20233:10 PM  ?

## 2021-10-12 ENCOUNTER — Telehealth: Payer: Self-pay

## 2021-10-12 NOTE — Telephone Encounter (Signed)
Per JC- okay to schedule as a NPT.  ?

## 2021-10-12 NOTE — Telephone Encounter (Signed)
Called patient and left VM to schedule with [facility] ?

## 2021-10-12 NOTE — Telephone Encounter (Signed)
-----   Message from Darreld Mclean, MD sent at 10/09/2021  6:02 PM EDT ----- ?For you, of course!   We will get her scheduled asap ? ?Kristine Garbe can we get her in as a new patient?  TY ?----- Message ----- ?From: Volanda Napoleon, MD ?Sent: 10/09/2021   3:32 PM EDT ?To: Darreld Mclean, MD ? ?Jess: Is her anyway that you can see my patient?  She really does not have a family doctor.  She has no oncologic issues.  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  She does have high blood pressure.  I am going to put her on Micardis. ? ?She really needs to have a wonderful family doctor.  I think you would really be good for her.  If you could take her on that would be wonderful. ? ?I hope you have a wonderful weekend and that you get to enjoy the nice Spring weather!!! ? ?Laurey Arrow ? ? ?

## 2021-10-21 ENCOUNTER — Ambulatory Visit: Payer: BC Managed Care – PPO | Admitting: Family Medicine

## 2021-10-21 ENCOUNTER — Encounter: Payer: Self-pay | Admitting: Family Medicine

## 2021-10-21 VITALS — BP 124/80 | HR 71 | Temp 98.1°F | Resp 18 | Ht 65.0 in | Wt 199.2 lb

## 2021-10-21 DIAGNOSIS — E785 Hyperlipidemia, unspecified: Secondary | ICD-10-CM

## 2021-10-21 DIAGNOSIS — Z13220 Encounter for screening for lipoid disorders: Secondary | ICD-10-CM

## 2021-10-21 DIAGNOSIS — I10 Essential (primary) hypertension: Secondary | ICD-10-CM | POA: Diagnosis not present

## 2021-10-21 DIAGNOSIS — Z131 Encounter for screening for diabetes mellitus: Secondary | ICD-10-CM | POA: Diagnosis not present

## 2021-10-21 DIAGNOSIS — Z1329 Encounter for screening for other suspected endocrine disorder: Secondary | ICD-10-CM

## 2021-10-21 DIAGNOSIS — R7303 Prediabetes: Secondary | ICD-10-CM

## 2021-10-21 MED ORDER — TELMISARTAN 40 MG PO TABS: 40.0000 mg | ORAL_TABLET | Freq: Every day | ORAL | 3 refills | Status: DC

## 2021-10-21 NOTE — Patient Instructions (Signed)
It was very nice to meet you today, it looks like your blood pressure is well controlled on current medication ?We would like to keep your blood pressure running 115- 135/70-85; if you find you are consistently either higher or lower than these numbers please let me know and we can make an adjustment ? ?I will touch base with radiology to see if a lung cancer screening CT would be beneficial for you ? ?I will be in touch with your labs ASAP ? ?Please see me in about 6 months to follow-up, we can do your pneumonia booster at that time ?

## 2021-10-21 NOTE — Progress Notes (Addendum)
[facility] ?[address], [address] ?[place], [place] [address] ?336 [phone] ?Fax [phone] ? ?Date:  10/21/2021  ? ?Name:  Angie Roman   DOB:  [birth year]   MRN:  [id number] ? ?PCP:  Pearline Cables, MD  ? ? ?Chief Complaint: New Patient (Initial Visit) (Concerns/ questions: Pt would like to make sure her BP meds are sufficient. ) ? ? ?History of Present Illness: ? ?Angie Roman is a 66 y.o. very pleasant female patient who presents with the following: ? ?Seen as a new patient today on request of Dr Myna Hidalgo  ?She has reactive lymphoid hyperplasia which they are following - however as of now no evidence of cancer has been seen on recent PET or lymph node bx  ? ?History of hyperlipidemia  ?Dr Myna Hidalgo actually started her on telmisartan at recent visit- she seems to be tolerating this well and her BP looks good now  ?This is her first BP medication  ?She does check her BP at home  ? ?She is from [place] ?She works in an Scientist, research (physical sciences) ?Her husband is deceased, her son lives in [place]- she is raising her 2 grand-daughters- The youngest is graduating HS and will be starting college this fall!  ? 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  I discussed this with the reading radiologist who felt that adding a lung cancer screening CT is of limited benefit ? ?Her last pap was about 5 years ago- offered to do today, her last was normal-she declines today, I offered to do this for her anytime ?Mammo is done [facility] -05/23/2021 ?Colon done 04/21/21- done per Dr Lanae Boast ? ?I offered pneumonia/Pneumovax today.  She plans to get her second dose of Shingrix tomorrow so declines for now ? ?BP Readings from Last 3 Encounters:  ?10/21/21 124/80  ?10/09/21 (!) 176/74  ?07/15/21 (!) 158/97  ? ? ? ?There are no  problems to display for this patient. ? ? ?Past Medical History:  ?Diagnosis Date  ? Bronchitis   ? High cholesterol   ? Hypertension   ? ? ?Past Surgical History:  ?Procedure Laterality Date  ? COLONOSCOPY WITH ESOPHAGOGASTRODUODENOSCOPY (EGD)  04/21/2021  ? INGUINAL LYMPH NODE BIOPSY Left 06/05/2021  ? Procedure: LEFT INGUINAL LYMPH NODE BIOPSY;  Surgeon: Quentin Ore, MD;  Location: [facility];  Service: General;  Laterality: Left;  ? ? ?Social History  ? ?Tobacco Use  ? Smoking status: Every Day  ?  Packs/day: 0.50  ?  Types: Cigarettes  ? Smokeless tobacco: Never  ?Vaping Use  ? Vaping Use: Never used  ?Substance Use Topics  ? Alcohol use: Yes  ?  Comment: occ  ? Drug use: Never  ? ? ?Family History  ?Problem Relation Age of Onset  ? Hypertension Mother   ? Hyperlipidemia Mother   ? Hypertension Sister   ? Hyperlipidemia Sister   ? Hyperlipidemia Brother   ? Hypertension Brother   ? Diabetes Brother   ? ? ?Allergies  ?Allergen Reactions  ? Iodinated Contrast Media Rash  ? ? ?Medication list has been reviewed and updated. ? ?No current outpatient medications on file prior to visit.  ? ?No current facility-administered medications on file prior to visit.  ? ? ?  Review of Systems: ? ?As per HPI- otherwise negative. ? ? ?Physical Examination: ?Vitals:  ? 10/21/21 1259  ?BP: 124/80  ?Pulse: 71  ?Resp: 18  ?Temp: 98.1 ?F (36.7 ?C)  ?SpO2: 99%  ? ?Vitals:  ? 10/21/21 1259  ?Weight: 199 lb 3.2 oz (90.4 kg)  ?Height: 5\' 5"  (1.651 m)  ? ?Body mass index is 33.15 kg/m?. ?Ideal Body Weight: Weight in (lb) to have BMI = 25: 149.9 ? ?GEN: no acute distress. ?HEENT: Atraumatic, Normocephalic.  ?Ears and Nose: No external deformity. ?CV: RRR, No M/G/R. No JVD. No thrill. No extra heart sounds. ?PULM: CTA B, no wheezes, crackles, rhonchi. No retractions. No resp. distress. No accessory muscle use. ?ABD: S, NT, ND, +BS. No rebound. No HSM. ?EXTR: No c/c/e ?PSYCH: Normally interactive. Conversant.   ? ? ?Assessment and Plan: ?Essential hypertension ? ?Screening for hyperlipidemia - Plan: Lipid panel ? ?Screening for thyroid disorder - Plan: TSH ? ?Screening for diabetes mellitus - Plan: Hemoglobin A1c ? ?Patient seen today to establish care.  She is taking telmisartan for blood pressure with good results.  Gave blood pressure parameters, continue current dosage for now ?Routine labs are pending as above ?Encouraged her to get a Pap smear at her convenience, we are glad to do this for her ?Pt notes she had body aches with statin in the past- if we do end up needing treatment may try Pravachol every 2nd day or so  ?Signed ? , MD ? ?Received herl abs 4/6- message to pt ?Results for orders placed or performed in visit on 10/21/21  ?Hemoglobin A1c  ?Result Value Ref Range  ? Hgb A1c MFr Bld 6.1 4.6 - 6.5 %  ?Lipid panel  ?Result Value Ref Range  ? Cholesterol 271 (H) 0 - 200 mg/dL  ? Triglycerides 111.0 0.0 - 149.0 mg/dL  ? HDL 54.40 >39.00 mg/dL  ? VLDL 22.2 0.0 - 40.0 mg/dL  ? LDL Cholesterol 195 (H) 0 - 99 mg/dL  ? Total CHOL/HDL Ratio 5   ? NonHDL 216.79   ?TSH  ?Result Value Ref Range  ? TSH 1.11 0.35 - 5.50 uIU/mL  ? ? ?The 10-year ASCVD risk score (Arnett DK, et al., 2019) is: 21.1% ?  Values used to calculate the score: ?    Age: 57 years ?    Sex: Female ?    Is Non-Hispanic African American: Yes ?    Diabetic: No ?    Tobacco smoker: Yes ?    Systolic Blood Pressure: 124 mmHg ?    Is BP treated: Yes ?    HDL Cholesterol: 54.4 mg/dL ?    Total Cholesterol: 271 mg/dL ? ?

## 2021-10-22 ENCOUNTER — Encounter: Payer: Self-pay | Admitting: Family Medicine

## 2021-10-22 DIAGNOSIS — E785 Hyperlipidemia, unspecified: Secondary | ICD-10-CM | POA: Insufficient documentation

## 2021-10-22 DIAGNOSIS — R7303 Prediabetes: Secondary | ICD-10-CM | POA: Insufficient documentation

## 2021-10-22 LAB — LIPID PANEL
Cholesterol: 271 mg/dL — ABNORMAL HIGH (ref 0–200)
HDL: 54.4 mg/dL (ref >39.00)
LDL Cholesterol: 195 mg/dL — ABNORMAL HIGH (ref 0–99)
NonHDL: 216.79
Total CHOL/HDL Ratio: 5
Triglycerides: 111 mg/dL (ref 0.0–149.0)
VLDL: 22.2 mg/dL (ref 0.0–40.0)

## 2021-10-22 LAB — TSH: TSH: 1.11 u[IU]/mL (ref 0.35–5.50)

## 2021-10-22 LAB — HEMOGLOBIN A1C: Hgb A1c MFr Bld: 6.1 % (ref 4.6–6.5)

## 2021-10-22 MED ORDER — PRAVASTATIN SODIUM 20 MG PO TABS: 20.0000 mg | ORAL_TABLET | Freq: Every day | ORAL | 3 refills | Status: DC

## 2021-10-22 NOTE — Addendum Note (Signed)
Addended by: Abbe Amsterdam C on: 10/22/2021 01:57 PM ? ? Modules accepted: Orders ? ?

## 2021-11-11 ENCOUNTER — Ambulatory Visit: Payer: BC Managed Care – PPO | Admitting: Physician Assistant

## 2022-03-05 ENCOUNTER — Encounter (HOSPITAL_COMMUNITY): Payer: BC Managed Care – PPO

## 2022-03-05 ENCOUNTER — Encounter (HOSPITAL_COMMUNITY): Payer: Self-pay

## 2022-03-05 ENCOUNTER — Ambulatory Visit (HOSPITAL_COMMUNITY)
Admission: RE | Admit: 2022-03-05 | Discharge: 2022-03-05 | Disposition: A | Discharge status: Home / Self Care | Payer: BC Managed Care – PPO | Source: Ambulatory Visit | Attending: Hematology & Oncology | Admitting: Hematology & Oncology

## 2022-03-05 DIAGNOSIS — R590 Localized enlarged lymph nodes: Secondary | ICD-10-CM | POA: Insufficient documentation

## 2022-03-05 DIAGNOSIS — C775 Secondary and unspecified malignant neoplasm of intrapelvic lymph nodes: Secondary | ICD-10-CM | POA: Diagnosis not present

## 2022-03-05 LAB — GLUCOSE, CAPILLARY: Glucose-Capillary: 94 mg/dL (ref 70–99)

## 2022-03-05 MED ORDER — FLUDEOXYGLUCOSE F - 18 (FDG) INJECTION
9.7500 | Freq: Once | INTRAVENOUS | Status: AC
  Administered 2022-03-05: 9.75 via INTRAVENOUS

## 2022-03-12 ENCOUNTER — Inpatient Hospital Stay (HOSPITAL_BASED_OUTPATIENT_CLINIC_OR_DEPARTMENT_OTHER): Payer: BC Managed Care – PPO | Admitting: Hematology & Oncology

## 2022-03-12 ENCOUNTER — Other Ambulatory Visit: Payer: Self-pay

## 2022-03-12 ENCOUNTER — Inpatient Hospital Stay: Payer: BC Managed Care – PPO | Attending: Hematology & Oncology

## 2022-03-12 ENCOUNTER — Encounter: Payer: Self-pay | Admitting: Hematology & Oncology

## 2022-03-12 VITALS — BP 145/73 | HR 88 | Temp 97.9°F | Resp 18 | Ht 65.0 in | Wt 198.8 lb

## 2022-03-12 DIAGNOSIS — R590 Localized enlarged lymph nodes: Secondary | ICD-10-CM

## 2022-03-12 DIAGNOSIS — Z79899 Other long term (current) drug therapy: Secondary | ICD-10-CM | POA: Diagnosis not present

## 2022-03-12 LAB — CBC WITH DIFFERENTIAL (CANCER CENTER ONLY)
Abs Immature Granulocytes: 0.02 10*3/uL (ref 0.00–0.07)
Basophils Absolute: 0 10*3/uL (ref 0.0–0.1)
Basophils Relative: 1 %
Eosinophils Absolute: 0.2 10*3/uL (ref 0.0–0.5)
Eosinophils Relative: 2 %
HCT: 41.4 % (ref 36.0–46.0)
Hemoglobin: 13.6 g/dL (ref 12.0–15.0)
Immature Granulocytes: 0 %
Lymphocytes Relative: 39 %
Lymphs Abs: 2.5 10*3/uL (ref 0.7–4.0)
MCH: 29.4 pg (ref 26.0–34.0)
MCHC: 32.9 g/dL (ref 30.0–36.0)
MCV: 89.6 fL (ref 80.0–100.0)
Monocytes Absolute: 0.4 10*3/uL (ref 0.1–1.0)
Monocytes Relative: 6 %
Neutro Abs: 3.3 10*3/uL (ref 1.7–7.7)
Neutrophils Relative %: 52 %
Platelet Count: 247 10*3/uL (ref 150–400)
RBC: 4.62 MIL/uL (ref 3.87–5.11)
RDW: 12.5 % (ref 11.5–15.5)
WBC Count: 6.4 10*3/uL (ref 4.0–10.5)
nRBC: 0 % (ref 0.0–0.2)

## 2022-03-12 LAB — CMP (CANCER CENTER ONLY)
ALT: 13 U/L (ref 0–44)
AST: 14 U/L — ABNORMAL LOW (ref 15–41)
Albumin: 4.5 g/dL (ref 3.5–5.0)
Alkaline Phosphatase: 93 U/L (ref 38–126)
Anion gap: 6 (ref 5–15)
BUN: 17 mg/dL (ref 8–23)
CO2: 28 mmol/L (ref 22–32)
Calcium: 10.2 mg/dL (ref 8.9–10.3)
Chloride: 104 mmol/L (ref 98–111)
Creatinine: 0.79 mg/dL (ref 0.44–1.00)
GFR, Estimated: >60 mL/min (ref >60)
Glucose, Bld: 100 mg/dL — ABNORMAL HIGH (ref 70–99)
Potassium: 4.4 mmol/L (ref 3.5–5.1)
Sodium: 138 mmol/L (ref 135–145)
Total Bilirubin: 0.4 mg/dL (ref 0.3–1.2)
Total Protein: 7.7 g/dL (ref 6.5–8.1)

## 2022-03-12 LAB — SAMPLE TO BLOOD BANK

## 2022-03-12 LAB — LACTATE DEHYDROGENASE: LDH: 192 U/L (ref 98–192)

## 2022-03-12 NOTE — Progress Notes (Signed)
Hematology and Oncology Follow Up Visit  Angie Roman [id number] [birth year] 66 y.o. 03/12/2022   Principle Diagnosis:  Florid lymphoid hyperplasia  Current Therapy:   Observation     Interim History:  Ms. Prewitt is coming for follow-up.  We last saw her in March.  Since then, she has been doing pretty well.  She really has had no specific complaints.  She does not mind the hot humid weather.  We did go ahead and do a PET scan on her.  This was done on 03/05/2022.  The PET scan still showed that she had adenopathy but this was stable.  She had a couple lymph nodes down in the pelvis that had a slightly higher SUV.  Left pelvic sidewall lymph node had an SUV of 9.6.  A left inguinal lymph node had an SUV of 10.05.  There were not larger in size.Marland Kitchen  She has had no abdominal pain.  There is no change in bowel or bladder habits.  She has had no nausea or vomiting.  She has had no cough or shortness of breath.  She has not felt any swollen lymph nodes.  There has been no leg swelling.  She has had no bleeding.  Overall, I would say performance status is probably ECOG 0.       Medications:  Current Outpatient Medications:    pravastatin (PRAVACHOL) 20 MG tablet, Take 1 tablet (20 mg total) by mouth daily., Disp: 90 tablet, Rfl: 3   telmisartan (MICARDIS) 40 MG tablet, Take 1 tablet (40 mg total) by mouth daily., Disp: 90 tablet, Rfl: 3  Allergies:  Allergies  Allergen Reactions   Iodinated Contrast Media Rash    Past Medical History, Surgical history, Social history, and Family History were reviewed and updated.  Review of Systems: Review of Systems  Constitutional: Negative.   HENT:  Negative.    Eyes: Negative.   Respiratory: Negative.    Cardiovascular: Negative.   Gastrointestinal: Negative.   Endocrine: Negative.   Genitourinary: Negative.    Musculoskeletal: Negative.   Skin: Negative.   Neurological: Negative.   Hematological: Negative.   Psychiatric/Behavioral: Negative.       Physical Exam:  height is 5\' 5"  (1.651 m) and weight is 198 lb 12.8 oz (90.2 kg). Her oral temperature is 97.9 F (36.6 C). Her blood pressure is 145/73 (abnormal) and her pulse is 88. Her respiration is 18 and oxygen saturation is 100%.   Wt Readings from Last 3 Encounters:  03/12/22 198 lb 12.8 oz (90.2 kg)  10/21/21 199 lb 3.2 oz (90.4 kg)  10/09/21 201 lb (91.2 kg)    Physical Exam Vitals reviewed.  HENT:     Head: Normocephalic and atraumatic.  Eyes:     Pupils: Pupils are equal, round, and reactive to light.  Cardiovascular:     Rate and Rhythm: Normal rate and regular rhythm.     Heart sounds: Normal heart sounds.  Pulmonary:     Effort: Pulmonary effort is normal.     Breath sounds: Normal breath sounds.  Abdominal:     General: Bowel sounds are normal.     Palpations: Abdomen is soft.  Musculoskeletal:        General: No tenderness or deformity. Normal range of motion.     Cervical back: Normal range of motion.  Lymphadenopathy:     Cervical: No cervical adenopathy.  Skin:    General: Skin is warm and dry.     Findings: No erythema or rash.  Neurological:     Mental Status: She is alert and oriented to person, place, and time.  Psychiatric:        Behavior: Behavior normal.        Thought Content: Thought content normal.        Judgment: Judgment normal.      Lab Results  Component Value Date   WBC 6.4 03/12/2022   HGB 13.6 03/12/2022   HCT 41.4 03/12/2022   MCV 89.6 03/12/2022   PLT 247 03/12/2022     Chemistry      Component Value Date/Time   NA 138 10/09/2021 1425   K 3.9 10/09/2021 1425   CL 104 10/09/2021 1425   CO2 29 10/09/2021 1425   BUN 12 10/09/2021 1425   CREATININE 0.60 10/09/2021 1425      Component Value Date/Time   CALCIUM 9.7 10/09/2021 1425   ALKPHOS 81 10/09/2021 1425   AST 13 (L) 10/09/2021 1425   ALT 12 10/09/2021 1425   BILITOT 0.3 10/09/2021 1425       Impression and Plan: Ms. Pellegrin is a very nice  66year-old African-American female.  She has florid reactive lymphoid hyperplasia.  There is no malignant lymphoproliferative process.  I am not sure if she is at a higher risk for a malignant process.  I looked at her blood smear.  I really do not see anything that looked suggestive on the blood smear.  Her lymphocytes are holding steady.  Her white cell count is holding steady.  At this point, I think we can probably do a PET scan in 6 months.  I think we can get her next year and have 1 done in the Spring.  I really think that what we are looking at is some that she will always have.  I just do not think that this is going to turn into some malignant.  I just do not see that we had to put her through any thing invasive right now.  I would think that the only way for [place] to get a good biopsy would be to do an open procedure.  These lymph nodes just do not that large.  I am glad that her quality of life is doing so well right now.  I will plan to get her back in March 2024.  We will get a PET scan about a week or so before I see her   Josph Macho, MD 8/25/20231:34 PM

## 2022-03-13 LAB — BETA 2 MICROGLOBULIN, SERUM: Beta-2 Microglobulin: 1.1 mg/L (ref 0.6–2.4)

## 2022-06-15 DIAGNOSIS — Z1231 Encounter for screening mammogram for malignant neoplasm of breast: Secondary | ICD-10-CM | POA: Diagnosis not present

## 2022-06-15 LAB — HM MAMMOGRAPHY

## 2022-10-08 ENCOUNTER — Inpatient Hospital Stay: Payer: BC Managed Care – PPO

## 2022-10-08 ENCOUNTER — Inpatient Hospital Stay: Payer: BC Managed Care – PPO | Admitting: Family

## 2022-10-11 ENCOUNTER — Inpatient Hospital Stay (HOSPITAL_BASED_OUTPATIENT_CLINIC_OR_DEPARTMENT_OTHER): Payer: BC Managed Care – PPO | Admitting: Family

## 2022-10-11 ENCOUNTER — Encounter: Payer: Self-pay | Admitting: Family

## 2022-10-11 ENCOUNTER — Inpatient Hospital Stay: Payer: BC Managed Care – PPO | Attending: Hematology & Oncology

## 2022-10-11 VITALS — BP 185/74 | HR 72 | Temp 97.7°F | Resp 17 | Wt 205.4 lb

## 2022-10-11 DIAGNOSIS — R590 Localized enlarged lymph nodes: Secondary | ICD-10-CM

## 2022-10-11 LAB — CBC WITH DIFFERENTIAL (CANCER CENTER ONLY)
Abs Immature Granulocytes: 0.01 10*3/uL (ref 0.00–0.07)
Basophils Absolute: 0 10*3/uL (ref 0.0–0.1)
Basophils Relative: 1 %
Eosinophils Absolute: 0.2 10*3/uL (ref 0.0–0.5)
Eosinophils Relative: 2 %
HCT: 40.8 % (ref 36.0–46.0)
Hemoglobin: 13.4 g/dL (ref 12.0–15.0)
Immature Granulocytes: 0 %
Lymphocytes Relative: 47 %
Lymphs Abs: 3 10*3/uL (ref 0.7–4.0)
MCH: 28.8 pg (ref 26.0–34.0)
MCHC: 32.8 g/dL (ref 30.0–36.0)
MCV: 87.6 fL (ref 80.0–100.0)
Monocytes Absolute: 0.4 10*3/uL (ref 0.1–1.0)
Monocytes Relative: 6 %
Neutro Abs: 2.8 10*3/uL (ref 1.7–7.7)
Neutrophils Relative %: 44 %
Platelet Count: 242 10*3/uL (ref 150–400)
RBC: 4.66 MIL/uL (ref 3.87–5.11)
RDW: 12.6 % (ref 11.5–15.5)
WBC Count: 6.5 10*3/uL (ref 4.0–10.5)
nRBC: 0 % (ref 0.0–0.2)

## 2022-10-11 LAB — CMP (CANCER CENTER ONLY)
ALT: 13 U/L (ref 0–44)
AST: 13 U/L — ABNORMAL LOW (ref 15–41)
Albumin: 4.3 g/dL (ref 3.5–5.0)
Alkaline Phosphatase: 85 U/L (ref 38–126)
Anion gap: 7 (ref 5–15)
BUN: 9 mg/dL (ref 8–23)
CO2: 30 mmol/L (ref 22–32)
Calcium: 9.5 mg/dL (ref 8.9–10.3)
Chloride: 103 mmol/L (ref 98–111)
Creatinine: 0.59 mg/dL (ref 0.44–1.00)
GFR, Estimated: >60 mL/min (ref >60)
Glucose, Bld: 108 mg/dL — ABNORMAL HIGH (ref 70–99)
Potassium: 4.1 mmol/L (ref 3.5–5.1)
Sodium: 140 mmol/L (ref 135–145)
Total Bilirubin: 0.3 mg/dL (ref 0.3–1.2)
Total Protein: 7.2 g/dL (ref 6.5–8.1)

## 2022-10-11 LAB — SAVE SMEAR(SSMR), FOR PROVIDER SLIDE REVIEW

## 2022-10-11 LAB — LACTATE DEHYDROGENASE: LDH: 183 U/L (ref 98–192)

## 2022-10-11 NOTE — Progress Notes (Signed)
Hematology and Oncology Follow Up Visit  Angie Roman [id number] 10/17/1955 67 y.o. 10/11/2022   Principle Diagnosis:  Florid lymphoid hyperplasia   Current Therapy:        Observation   Interim History:  Angie Roman is here today for follow-up. She is doing well and has no complaints at this time.  No issue with frequent or recurrent infections.  No fever, chills, n/v, cough, rash, dizziness, SOB, chest pain, palpitations, abdominal pain or changes in bowel or bladder habits.  No blood loss, bruising or petechiae.  No swelling, tenderness, numbness or tingling in her extremities.  No falls or syncope.  Appetite is good. She admits that she needs to better hydrate throughout the day. Her weight is stable at 205 lbs.    ECOG Performance Status: 1 - Symptomatic but completely ambulatory  Medications:  Allergies as of 10/11/2022       Reactions   Iodinated Contrast Media Rash        Medication List        Accurate as of October 11, 2022  1:59 PM. If you have any questions, ask your nurse or doctor.          pravastatin 20 MG tablet Commonly known as: PRAVACHOL Take 1 tablet (20 mg total) by mouth daily.   telmisartan 40 MG tablet Commonly known as: Micardis Take 1 tablet (40 mg total) by mouth daily.        Allergies:  Allergies  Allergen Reactions   Iodinated Contrast Media Rash    Past Medical History, Surgical history, Social history, and Family History were reviewed and updated.  Review of Systems: All other 10 point review of systems is negative.   Physical Exam:  weight is 205 lb 6.4 oz (93.2 kg). Her oral temperature is 97.7 F (36.5 C). Her blood pressure is 185/74 (abnormal) and her pulse is 72. Her respiration is 17 and oxygen saturation is 100%.   Wt Readings from Last 3 Encounters:  10/11/22 205 lb 6.4 oz (93.2 kg)  03/12/22 198 lb 12.8 oz (90.2 kg)  10/21/21 199 lb 3.2 oz (90.4 kg)    Ocular: Sclerae unicteric, pupils equal, round and  reactive to light Ear-nose-throat: Oropharynx clear, dentition fair Lymphatic: No cervical or supraclavicular adenopathy Lungs no rales or rhonchi, good excursion bilaterally Heart regular rate and rhythm, no murmur appreciated Abd soft, nontender, positive bowel sounds MSK no focal spinal tenderness, no joint edema Neuro: non-focal, well-oriented, appropriate affect Breasts: Deferred   Lab Results  Component Value Date   WBC 6.5 10/11/2022   HGB 13.4 10/11/2022   HCT 40.8 10/11/2022   MCV 87.6 10/11/2022   PLT 242 10/11/2022   Lab Results  Component Value Date   FERRITIN 81 07/15/2021   IRON 71 04/27/2021   TIBC 295 04/27/2021   UIBC 224 04/27/2021   IRONPCTSAT 24 04/27/2021   Lab Results  Component Value Date   RETICCTPCT 1.5 07/15/2021   RBC 4.66 10/11/2022   No results found for: "KPAFRELGTCHN", "LAMBDASER", "KAPLAMBRATIO" No results found for: "IGGSERUM", "IGA", "IGMSERUM" No results found for: "TOTALPROTELP", "ALBUMINELP", "A1GS", "A2GS", "BETS", "BETA2SER", "GAMS", "MSPIKE", "SPEI"   Chemistry      Component Value Date/Time   NA 140 10/11/2022 1304   K 4.1 10/11/2022 1304   CL 103 10/11/2022 1304   CO2 30 10/11/2022 1304   BUN 9 10/11/2022 1304   CREATININE 0.59 10/11/2022 1304      Component Value Date/Time   CALCIUM 9.5 10/11/2022 1304  ALKPHOS 85 10/11/2022 1304   AST 13 (L) 10/11/2022 1304   ALT 13 10/11/2022 1304   BILITOT 0.3 10/11/2022 1304       Impression and Plan: Angie Roman is a very nice 67 yo with history of florid reactive lymphoid hyperplasia. So far there has been no malignant lymphoproliferative process.  She has had hypermetabolic abdominopelvic adenopathy. WBC count remains stable.  We will repeat a PET scan on her within the next week or so to re-evaluate.  Follow-up in 6 months.   Lottie Dawson, NP 3/25/20241:59 PM

## 2022-10-21 ENCOUNTER — Telehealth: Payer: Self-pay | Admitting: *Deleted

## 2022-10-21 NOTE — Telephone Encounter (Signed)
Per 10/11/22 los - called and was unable to lvm - mailed calendar

## 2022-10-27 ENCOUNTER — Other Ambulatory Visit: Payer: Self-pay | Admitting: Family Medicine

## 2022-10-27 ENCOUNTER — Encounter: Payer: Self-pay | Admitting: *Deleted

## 2022-10-27 DIAGNOSIS — E785 Hyperlipidemia, unspecified: Secondary | ICD-10-CM

## 2022-11-09 ENCOUNTER — Other Ambulatory Visit: Payer: Self-pay | Admitting: Family Medicine

## 2022-11-11 ENCOUNTER — Encounter: Payer: Self-pay | Admitting: *Deleted

## 2022-12-23 IMAGING — CT CT ABD-PELV W/ CM
2 of 5 series · 16 of 46 positions shown, 18 images · IV contrast (Omnipaque)
Comparison: None.

CLINICAL DATA: Bloody diarrhea, cramping, nausea

EXAM:
CT ABDOMEN AND PELVIS WITH CONTRAST
TECHNIQUE: Multidetector CT imaging of the abdomen and pelvis was performed
using the standard protocol following bolus administration of
intravenous contrast.
CONTRAST:  85mL OMNIPAQUE IOHEXOL 350 MG/ML SOLN

[Series 2: axial st · axial · 0.98mm/px · z∈[-529,-89]mm · 13 of 100 slices shown, 15 images]
[im 6/100  soft-tissue]
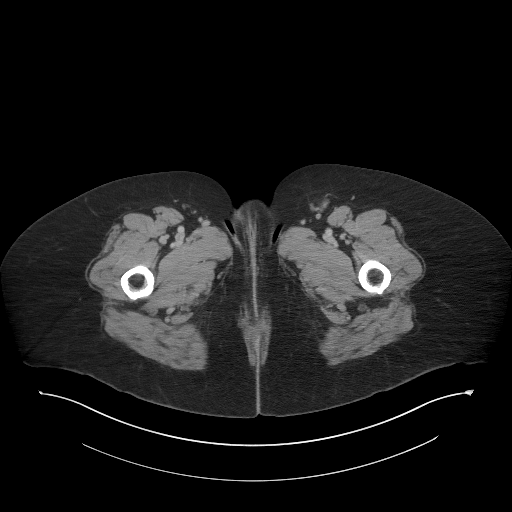
[im 6/100  bone]
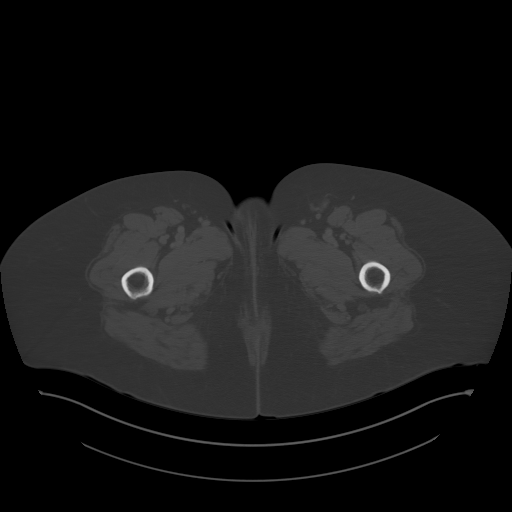
[im 16/100  soft-tissue]
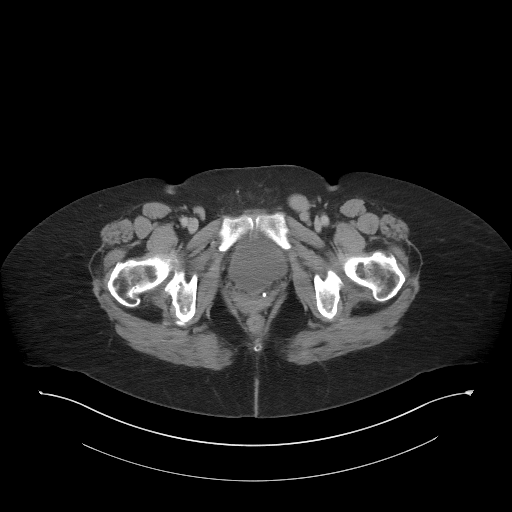
[im 21/100  soft-tissue]
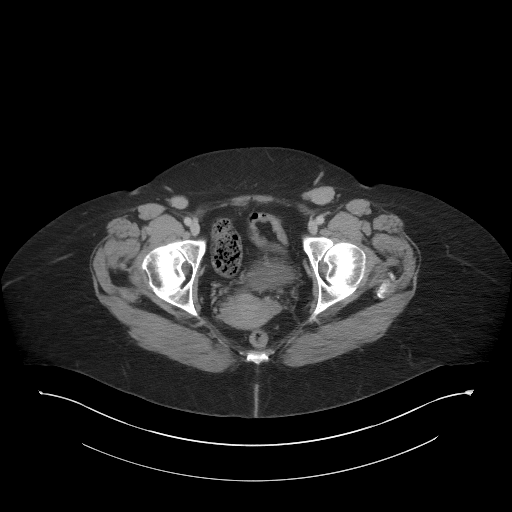
[im 27/100  soft-tissue]
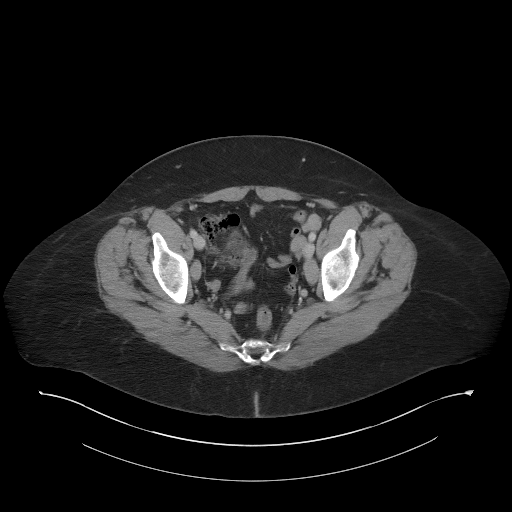
[im 37/100  soft-tissue]
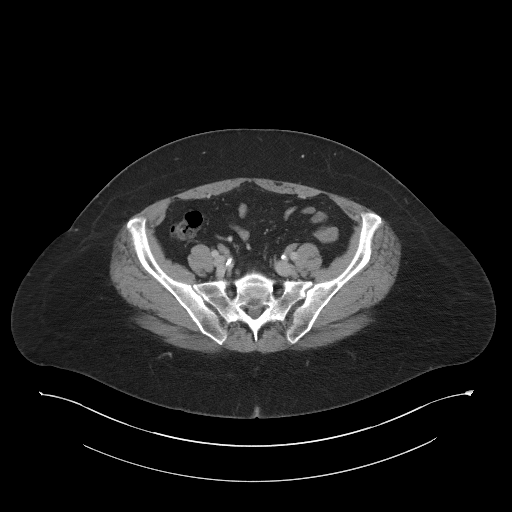
[im 42/100  soft-tissue]
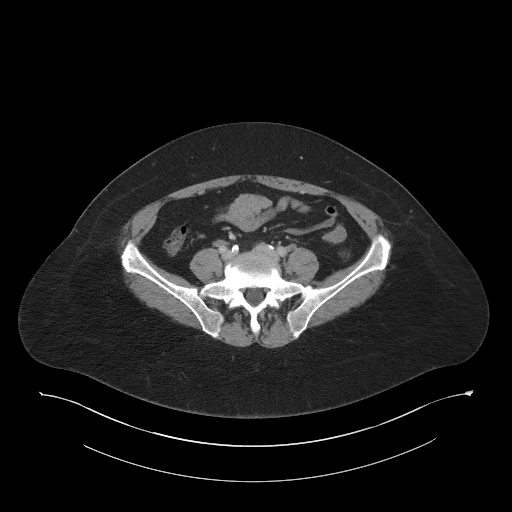
[im 53/100  soft-tissue]
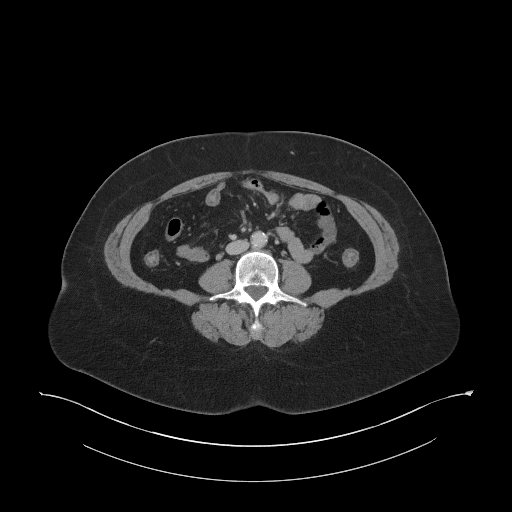
[im 58/100  soft-tissue]
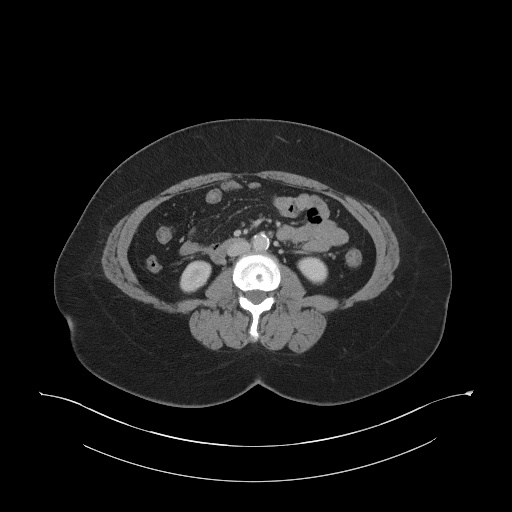
[im 63/100  soft-tissue]
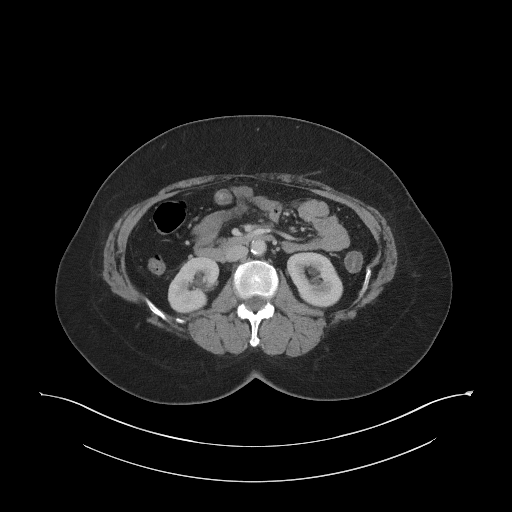
[im 63/100  bone]
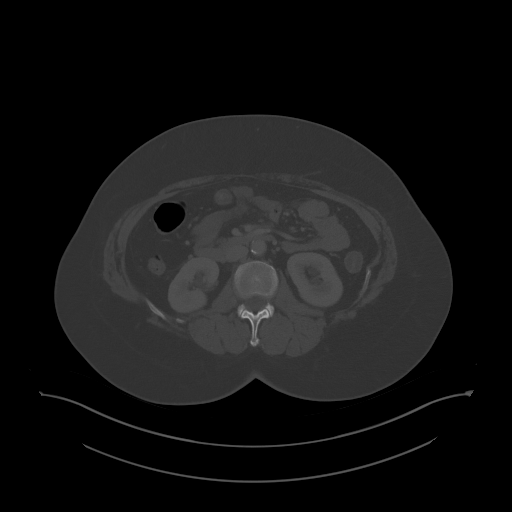
[im 73/100  soft-tissue]
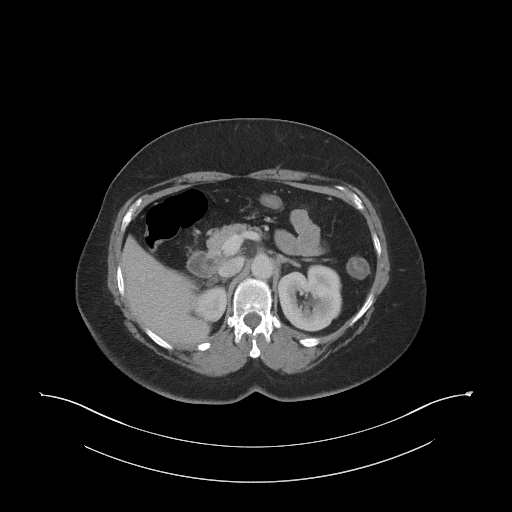
[im 79/100  soft-tissue]
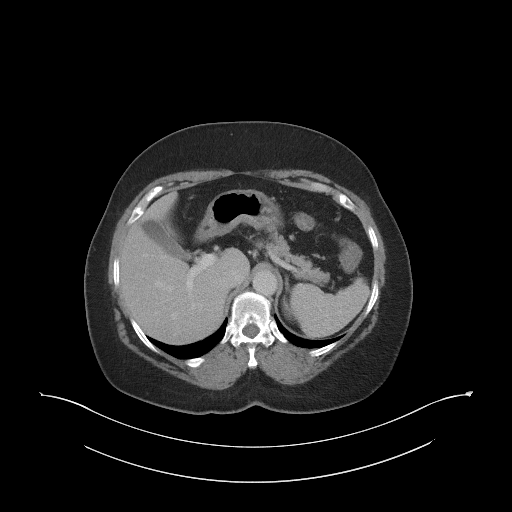
[im 84/100  soft-tissue]
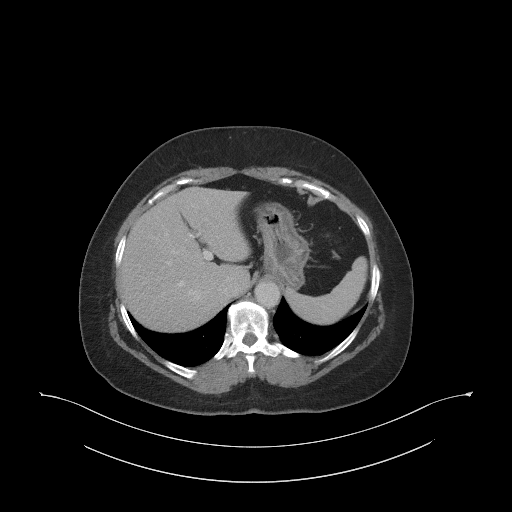
[im 94/100  soft-tissue]
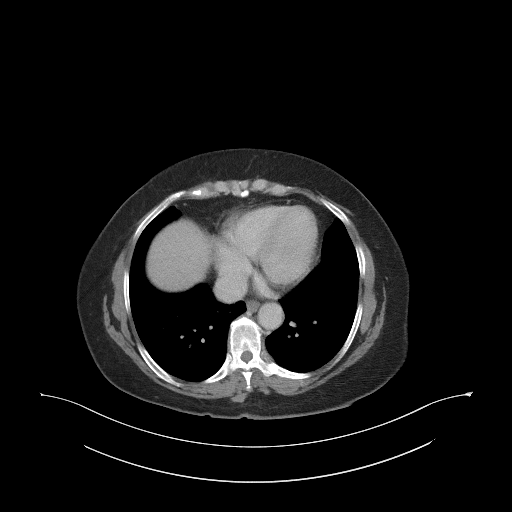

[Series 5: coronal st · coronal · 0.93mm/px · 3 of 99 slices shown]
[im 33/99  soft-tissue]
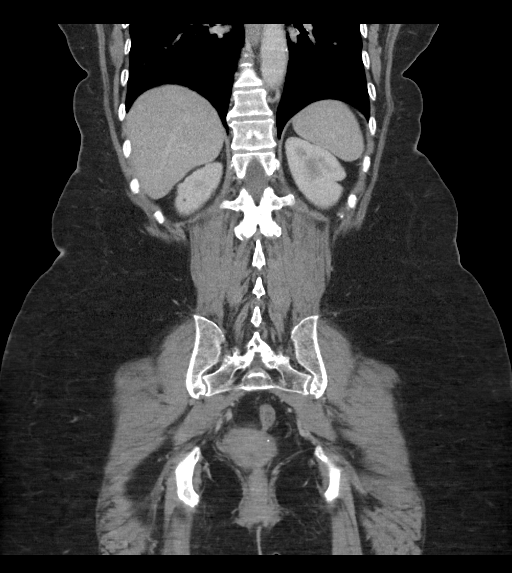
[im 44/99  soft-tissue]
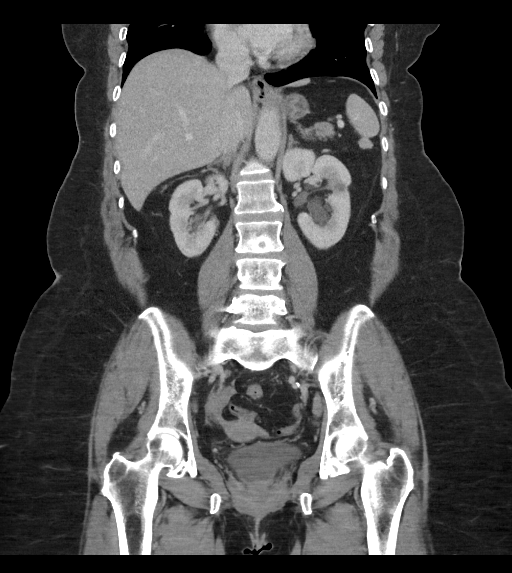
[im 55/99  soft-tissue]
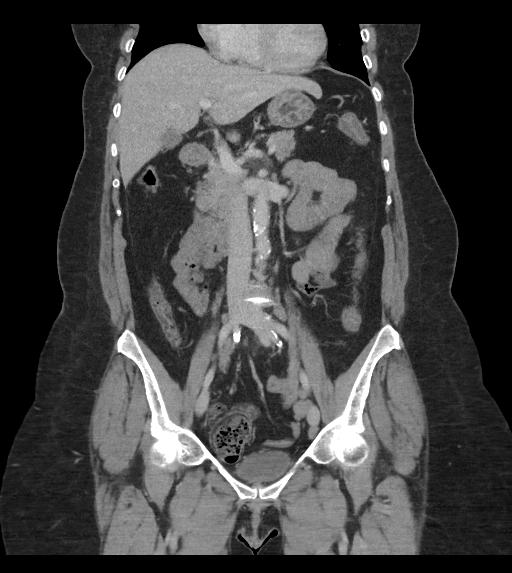

[16 of 46 positions shown; findings below may reference images not displayed]

FINDINGS: Lower chest: The lung bases are clear. The imaged heart is
unremarkable.

Hepatobiliary: The liver and gallbladder are unremarkable.

Pancreas: Unremarkable.

Spleen: Unremarkable.

Adrenals/Urinary Tract: The adrenals are unremarkable.

The kidneys are unremarkable, with no focal lesion, stone,
hydronephrosis, or hydroureter.

Stomach/Bowel: The stomach is unremarkable. There is no evidence of
bowel obstruction.

There is diffuse thickening of the distal transverse and descending
colon wall consistent with nonspecific infectious or inflammatory
colitis. There is no other abnormal bowel wall thickening.

Vascular/Lymphatic: There is scattered calcified atherosclerotic
plaque throughout the nonaneurysmal abdominal aorta. The major
branch vessels are patent. The main portal and splenic veins are
patent.

There is a 1.0 cm lymph node adjacent to the left common iliac
artery (2-55). There are multiple additional enlarged lymph nodes in
the pelvis measuring up to 1.5 cm in short axis on the left and
cm in short axis on the right. There are bulky bilateral inguinal
lymph nodes measuring up to 2.4 cm in short axis on the left and
cm in short axis on the right.

Reproductive: The uterus and adnexa are unremarkable.

Other: There is no ascites or free air.

Musculoskeletal: There is no acute osseous abnormality or aggressive
osseous lesion. There are prominent Schmorl's node along the
inferior L3 endplate and superior L5 endplate.
IMPRESSION: 1. Diffuse thickening of the distal transverse and descending colon
wall consistent with nonspecific infectious/inflammatory colitis.
2. Bulky pelvic and bilateral inguinal lymphadenopathy concerning
for lymphoma or metastsatic disease. Oncology referral and tissue
sampling are recommended.

Aortic Atherosclerosis ([id number]-1A6.6).

## 2023-01-20 IMAGING — CT NM PET [PERSON_NAME] INITIAL (PI) SKULL BASE T - THIGH
7 series · 25 of 25 positions shown · non-contrast
Comparison: CT April 13, 2021

CLINICAL DATA: Initial treatment strategy for pelvic
lymphadenopathy.

EXAM:
NUCLEAR MEDICINE PET SKULL BASE TO THIGH
TECHNIQUE: 10.5 a mCi F-18 FDG was injected intravenously. Full-ring PET
imaging was performed from the skull base to thigh after the
radiotracer. CT data was obtained and used for attenuation
correction and anatomic localization.
Fasting blood glucose: 104 mg/dl

[Series 3: pet sk_thigh ac · axial · 5.0mm · 4.07mm/px · z∈[-1330,-438]mm · 6 of 224 slices shown]
[im 1/224]
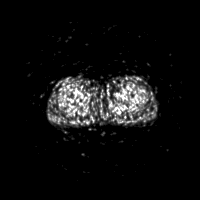
[im 45/224]
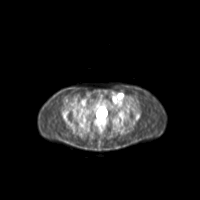
[im 90/224]
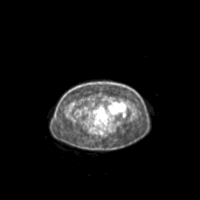
[im 134/224]
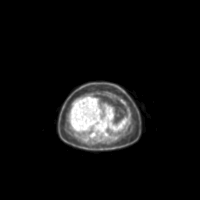
[im 179/224]
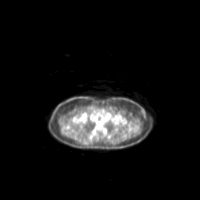
[im 224/224]
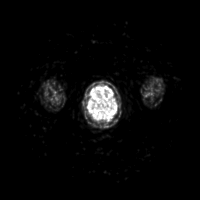

[Series 4: ct sk_thigh 5.0 bf37 · axial · 5.0mm · 0.98mm/px · z∈[-1330,-438]mm · 5 of 224 slices shown]
[im 1/224]
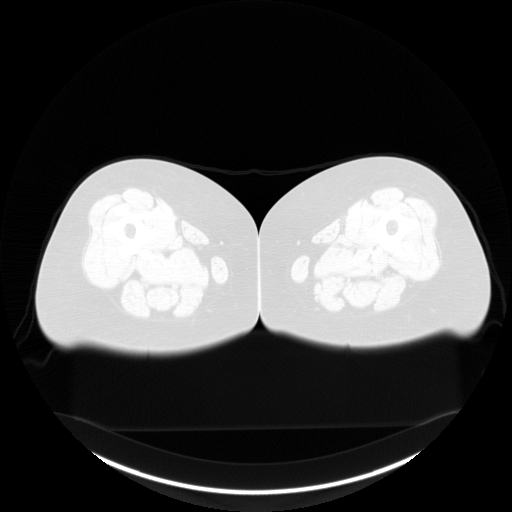
[im 56/224  brain]
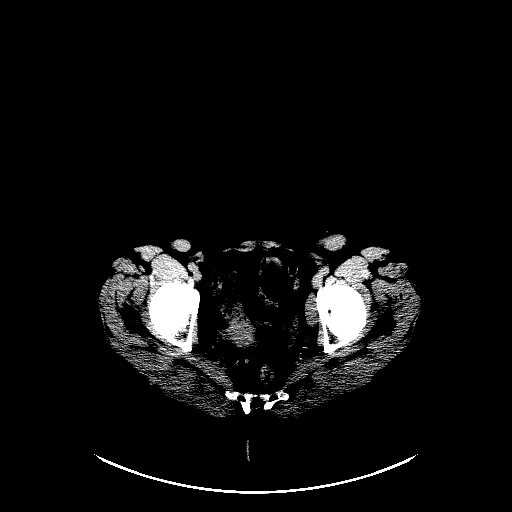
[im 112/224]
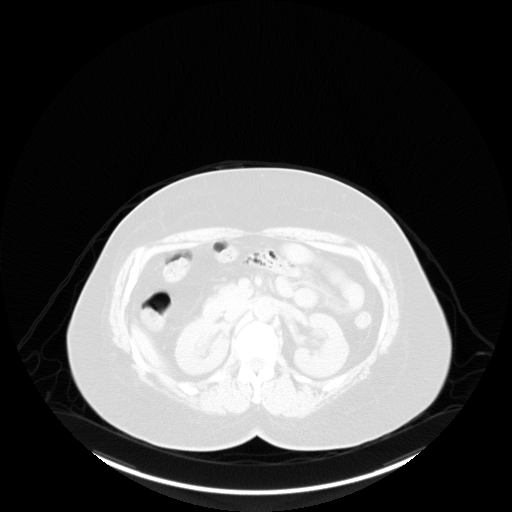
[im 168/224]
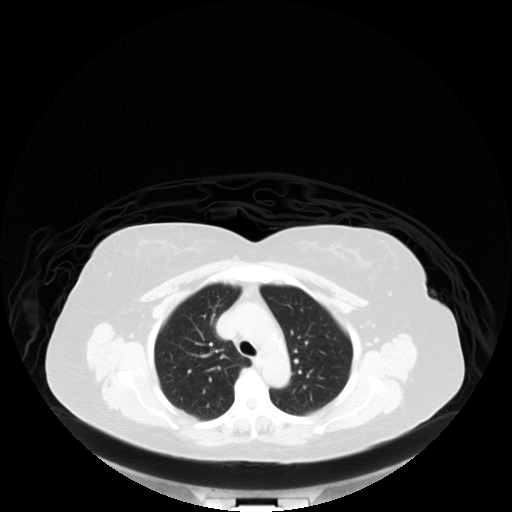
[im 224/224  brain]
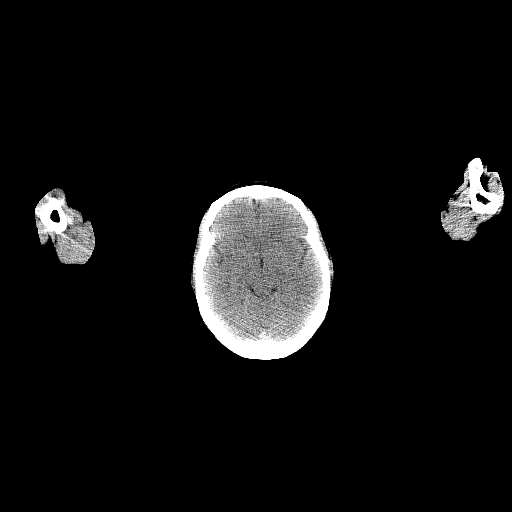

[Series 5: pet sk_thigh (person_name) · axial · 5.0mm · 4.07mm/px · z∈[-1330,-438]mm · 5 of 224 slices shown]
[im 1/224]
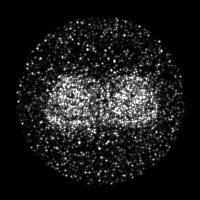
[im 56/224]
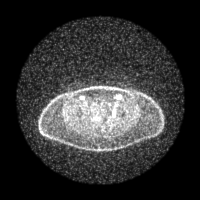
[im 112/224]
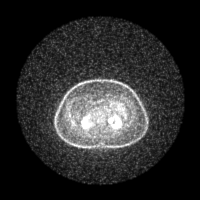
[im 168/224]
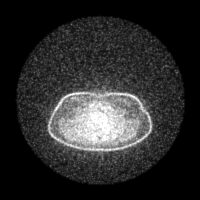
[im 224/224]
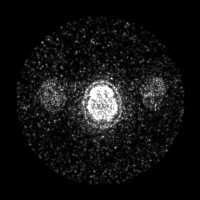

[Series 8: ct sk_thigh 5.0 br59 lung_bone · axial · 5.0mm · 0.64mm/px · z∈[-848,-596]mm · 2 of 64 slices shown]
[im 1/64]
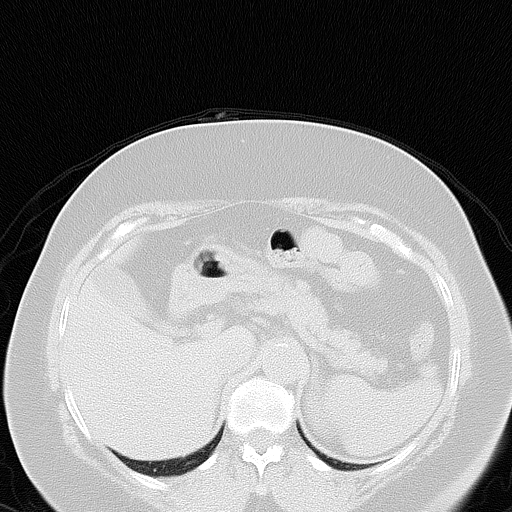
[im 64/64  brain]
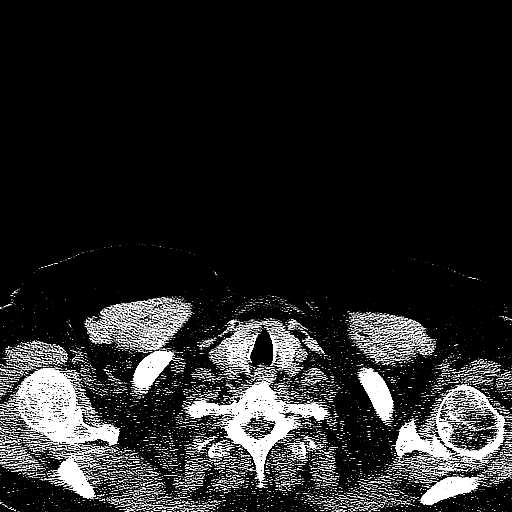

[Series 603: fused cor · 1 of 53 slices shown]
[im 1/53]
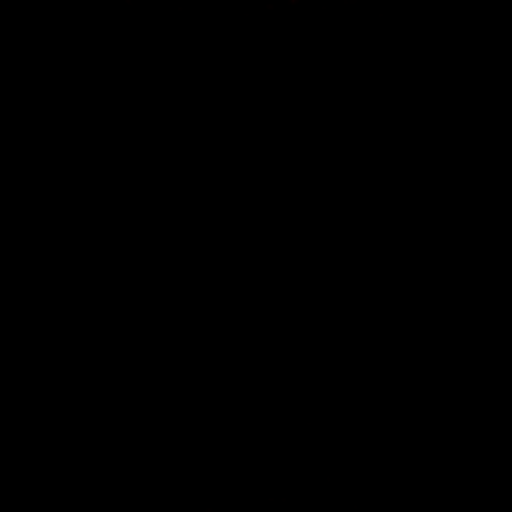

[Series 604: <mip collection> · coronal · 1.85mm/px · 1 of 32 slices shown]
[im 1/32]
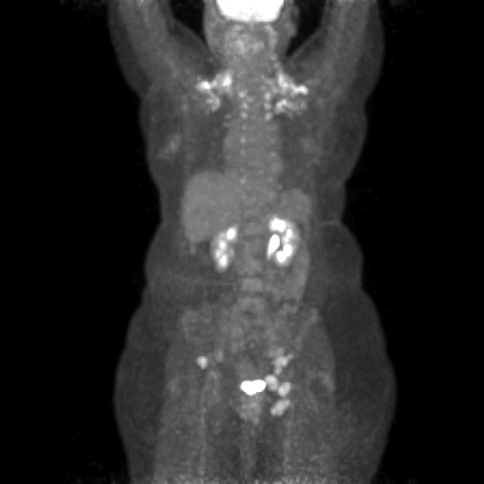

[Series 605: range-ct sk_thigh 5.0 bf37-(person_name)<(person_n · 5 of 218 slices shown]
[im 1/218]
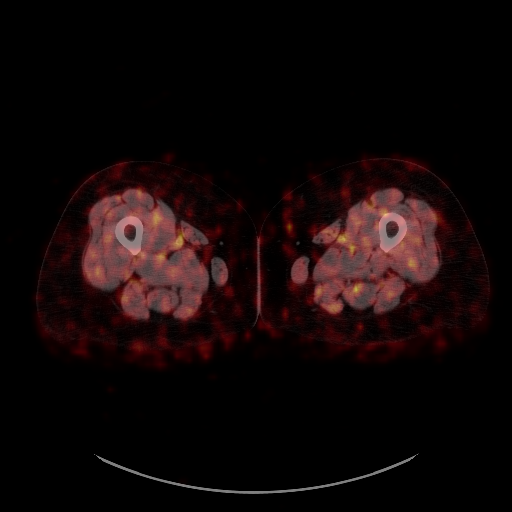
[im 55/218]
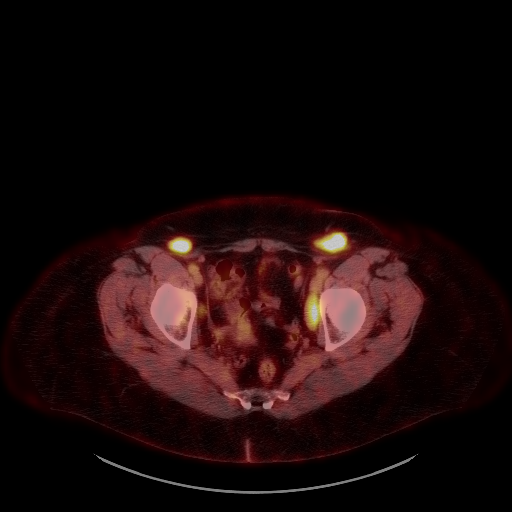
[im 109/218]
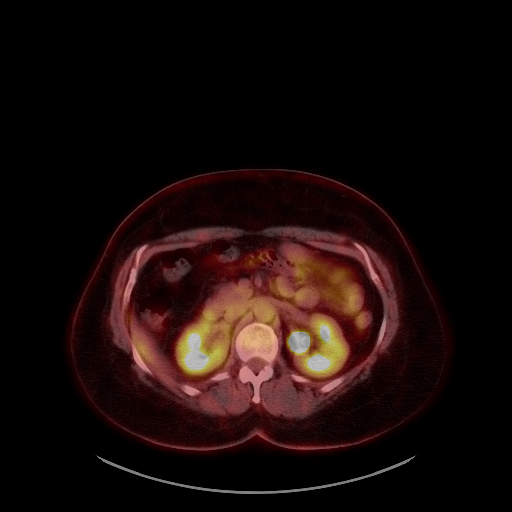
[im 163/218]
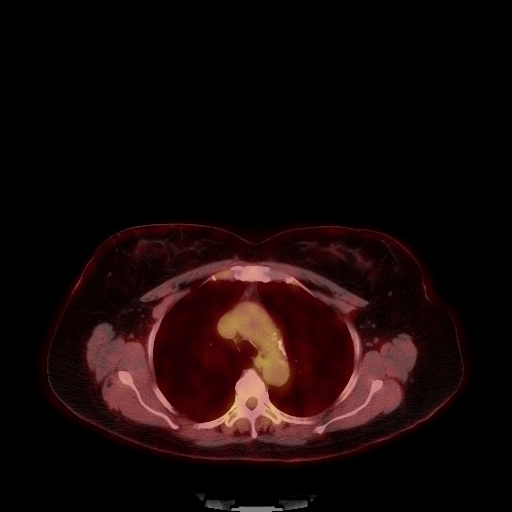
[im 218/218]
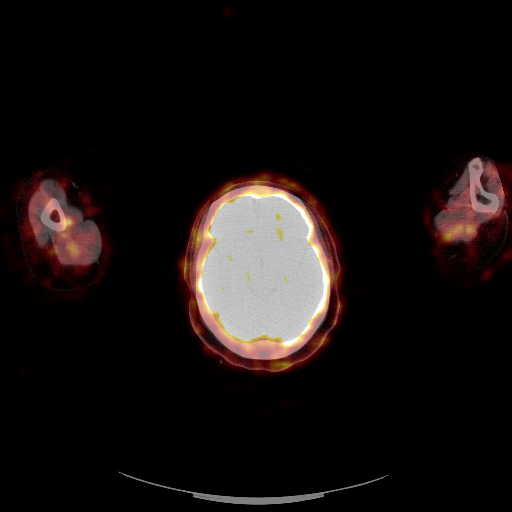

[25 of 25 positions shown; findings below may reference images not displayed]

FINDINGS: Mediastinal blood pool activity: SUV max

Liver activity: SUV max

NECK: No hypermetabolic lymph nodes in the neck.

Hypermetabolic hyperplasia of the tonsils with a max SUV of 7.5.

Incidental CT findings: Dental hardware. No discrete thyroid nodule.

CHEST: No hypermetabolic mediastinal, hilar or axillary nodes. No
suspicious pulmonary nodules on the CT scan.

Extensive hypermetabolic activity associated with fat throughout the
bilateral shoulder girdle/upper thorax, consistent with benign
hypermetabolic brown fat.

Incidental CT findings: No focal consolidation. No pleural effusion.
Aortic atherosclerosis without aneurysmal dilation. Coronary artery
calcifications. Size heart. No significant pericardial
effusion/thickening.

ABDOMEN/PELVIS:

Hypermetabolic left greater than right iliac chain, pelvic sidewall
and inguinal adenopathy. Index lesions are as follows:

- Hypermetabolic left common iliac lymph node measures 1.2 x 1.1 cm
on image 141/4 with a max SUV

- Hypermetabolic left pelvic sidewall lymph nodes measures 2 3.3 x
1.4 cm on image 168/4 with a max SUV of 6.90.

- Hypermetabolic left inguinal lymph node measures 3.2 x 1.9 cm on
image 177/4 with a max SUV

No hypermetabolic abdominal lymph nodes.

No abnormal hypermetabolic activity within the liver, pancreas,
adrenal glands, or spleen.

Incidental CT findings: Unremarkable noncontrast appearance the
liver, spleen gallbladder, pancreas and kidneys. No evidence of
bowel obstruction.

SKELETON: No focal hypermetabolic activity to suggest skeletal
metastasis.

Incidental CT findings: No aggressive lytic or blastic lesion of
bone.
IMPRESSION: 1. Left greater than right hypermetabolic pelvic and inguinal
adenopathy.

2. No hypermetabolic adenopathy in the abdomen or above the
diaphragm. Normal size spleen without focal splenic lesion.

3. Symmetric hypermetabolic hyperplasia of tonsils, nonspecific but
favored reactive.

## 2023-01-23 ENCOUNTER — Other Ambulatory Visit: Payer: Self-pay | Admitting: Family Medicine

## 2023-01-23 DIAGNOSIS — E785 Hyperlipidemia, unspecified: Secondary | ICD-10-CM

## 2023-02-03 ENCOUNTER — Other Ambulatory Visit: Payer: Self-pay | Admitting: Family Medicine

## 2023-04-13 ENCOUNTER — Ambulatory Visit: Payer: BC Managed Care – PPO | Admitting: Hematology & Oncology

## 2023-04-13 ENCOUNTER — Other Ambulatory Visit: Payer: BC Managed Care – PPO

## 2023-08-30 DIAGNOSIS — Z0000 Encounter for general adult medical examination without abnormal findings: Secondary | ICD-10-CM | POA: Diagnosis not present

## 2023-08-30 DIAGNOSIS — E785 Hyperlipidemia, unspecified: Secondary | ICD-10-CM | POA: Diagnosis not present

## 2023-08-30 DIAGNOSIS — Z79899 Other long term (current) drug therapy: Secondary | ICD-10-CM | POA: Diagnosis not present

## 2023-08-30 DIAGNOSIS — S83419A Sprain of medial collateral ligament of unspecified knee, initial encounter: Secondary | ICD-10-CM | POA: Diagnosis not present

## 2023-08-30 DIAGNOSIS — R109 Unspecified abdominal pain: Secondary | ICD-10-CM | POA: Diagnosis not present

## 2023-08-30 DIAGNOSIS — I10 Essential (primary) hypertension: Secondary | ICD-10-CM | POA: Diagnosis not present

## 2023-08-30 DIAGNOSIS — F17210 Nicotine dependence, cigarettes, uncomplicated: Secondary | ICD-10-CM | POA: Diagnosis not present

## 2023-10-24 DIAGNOSIS — M25561 Pain in right knee: Secondary | ICD-10-CM | POA: Diagnosis not present

## 2024-05-17 LAB — HM MAMMOGRAPHY

## 2024-05-28 NOTE — Progress Notes (Deleted)
  [facility] [address] [place], [place] [address] [phone]  Date:  05/31/2024   Name:  Angie Roman   DOB:  04/06/1956   MRN:  [id number]  PCP:  Angie Harlene BROCKS, MD    Chief Complaint: No chief complaint on file.   History of Present Illness:  Angie Roman is a 68 y.o. very pleasant female patient who presents with the following:  Patient seen today for a welcome to Medicare physical I have seen her once in the past, April 2023 History of prediabetes, dyslipidemia, reactive lymphoid hyperplasia  -Flu vaccine - Pneumonia vaccine -mammogram -Bone density scan -Labs  Discussed the use of AI scribe software for clinical note transcription with the patient, who gave verbal consent to proceed.  History of Present Illness     Patient Active Problem List   Diagnosis Date Noted   Prediabetes 10/22/2021   Dyslipidemia 10/22/2021    Past Medical History:  Diagnosis Date   Bronchitis    High cholesterol    Hypertension     Past Surgical History:  Procedure Laterality Date   COLONOSCOPY WITH ESOPHAGOGASTRODUODENOSCOPY (EGD)  04/21/2021   INGUINAL LYMPH NODE BIOPSY Left 06/05/2021   Procedure: LEFT INGUINAL LYMPH NODE BIOPSY;  Surgeon: Lyndel Deward PARAS, MD;  Location: [facility];  Service: General;  Laterality: Left;    Social History   Tobacco Use   Smoking status: Every Day    Current packs/day: 0.50    Types: Cigarettes   Smokeless tobacco: Never  Vaping Use   Vaping status: Never Used  Substance Use Topics   Alcohol  use: Yes    Comment: occ   Drug use: Never    Family History  Problem Relation Age of Onset   Hypertension Mother    Hyperlipidemia Mother    Hypertension Sister    Hyperlipidemia Sister    Hyperlipidemia Brother    Hypertension Brother    Diabetes Brother     Allergies  Allergen Reactions   Iodinated Contrast Media Rash    Medication  list has been reviewed and updated.  Current Outpatient Medications on File Prior to Visit  Medication Sig Dispense Refill   pravastatin  (PRAVACHOL ) 20 MG tablet Take 1 tablet by mouth once daily 90 tablet 0   telmisartan  (MICARDIS ) 40 MG tablet Take 1 tablet by mouth once daily 90 tablet 0   No current facility-administered medications on file prior to visit.    Review of Systems:  As per HPI- otherwise negative.   Physical Examination: There were no vitals filed for this visit. There were no vitals filed for this visit. There is no height or weight on file to calculate BMI. Ideal Body Weight:    GEN: no acute distress. HEENT: Atraumatic, Normocephalic.  Ears and Nose: No external deformity. CV: RRR, No M/G/R. No JVD. No thrill. No extra heart sounds. PULM: CTA B, no wheezes, crackles, rhonchi. No retractions. No resp. distress. No accessory muscle use. ABD: S, NT, ND, +BS. No rebound. No HSM. EXTR: No c/c/e PSYCH: Normally interactive. Conversant.    Assessment and Plan: No diagnosis found.  Assessment & Plan   Signed Harlene Watt, MD

## 2024-05-30 NOTE — Progress Notes (Signed)
 [facility] [address], [address] [place], [place] [address] [phone]  Date:  05/31/2024   Name:  Angie Roman   DOB:  1956/03/18   MRN:  [id number]  PCP:  Watt Harlene BROCKS, MD    Chief Complaint: Annual Exam   History of Present Illness:  Angie Roman is a 68 y.o. 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  Also history of essential hypertension, hyperlipidemia, prediabetes She saw hematology oncology in March 2024-at that time they mentioned a PET scan and 17-month follow-up but I do not think these were accomplished  She is a widow, she is raising her 2 granddaughters I offered her a Pap smear previously-can offer this again today- she declines today  Mammogram up-to-date, 10/25 Can offer a CT lung cancer screening Colon cancer screening; she did have a colonoscopy per Dr Marvis, she was given 10 years   Discussed the use of AI scribe software for clinical note transcription with the patient, who gave verbal consent to proceed.  History of Present Illness Angie Roman is a 68 year old female who presents for an annual physical exam.  She has not experienced significant changes since her last visit. She is raising two daughters, one in her third year of college and the other working in a nursing home. She works in an scientist, research (physical sciences), specifically in aeronautical engineer.  She has a history of smoking for at least thirty years and currently smokes about half a pack per day. She mentions that she used to smoke less in the past.  She has undergone a colonoscopy and was advised a ten-year interval for the next one. She has had three PET scans, with the last one in August 2023, showing stable results. She mentions the financial burden of frequent scans.  She  experiences fatigue and aching bones, similar to a friend who had a vitamin D deficiency. She has not been taking her medications for cholesterol and blood pressure for a while. Her blood pressure readings at home range between 140 and 150 mmHg.  She has not had a flu shot this year and is open to receiving it during the visit. She reports difficulty hearing and suspects earwax buildup.   Patient Active Problem List   Diagnosis Date Noted   Prediabetes 10/22/2021   Dyslipidemia 10/22/2021    Past Medical History:  Diagnosis Date   Bronchitis    High cholesterol    Hypertension     Past Surgical History:  Procedure Laterality Date   COLONOSCOPY WITH ESOPHAGOGASTRODUODENOSCOPY (EGD)  04/21/2021   INGUINAL LYMPH NODE BIOPSY Left 06/05/2021   Procedure: LEFT INGUINAL LYMPH NODE BIOPSY;  Surgeon: Lyndel Deward PARAS, MD;  Location: [facility];  Service: General;  Laterality: Left;    Social History   Tobacco Use   Smoking status: Every Day    Current packs/day: 0.50    Types: Cigarettes   Smokeless tobacco: Never  Vaping Use   Vaping status: Never Used  Substance Use Topics   Alcohol  use: Yes    Comment: occ   Drug use: Never    Family History  Problem Relation Age of Onset   Hypertension Mother    Hyperlipidemia Mother    Hypertension Sister    Hyperlipidemia Sister  Hyperlipidemia Brother    Hypertension Brother    Diabetes Brother     Allergies  Allergen Reactions   Iodinated Contrast Media Rash    Medication list has been reviewed and updated.  No current outpatient medications on file prior to visit.   No current facility-administered medications on file prior to visit.    Review of Systems:  As per HPI- otherwise negative.   Physical Examination: Vitals:   05/31/24 1048  BP: (!) 146/94  Pulse: 96  Temp: 97.7 F (36.5 C)  SpO2: 99%   Vitals:   05/31/24 1048  Weight: 200 lb (90.7 kg)  Height: 5' 5 (1.651 m)   Body  mass index is 33.28 kg/m. Ideal Body Weight: Weight in (lb) to have BMI = 25: 149.9  GEN: no acute distress.  Obese, looks well  HEENT: Atraumatic, Normocephalic.  Cerumen both ears,  oropharynx normal.  PEERL,EOMI.   Ears and Nose: No external deformity. CV: RRR, No M/G/R. No JVD. No thrill. No extra heart sounds. PULM: CTA B, no wheezes, crackles, rhonchi. No retractions. No resp. distress. No accessory muscle use. ABD: S, NT, ND. No rebound. No HSM. EXTR: No c/c/e PSYCH: Normally interactive. Conversant.   VC obtained and CMA staff irrigated both ears with successful removal of cerumen - pt tolerated well with no complication noted  Assessment and Plan: Physical exam  Dyslipidemia - Plan: Lipid panel, pravastatin  (PRAVACHOL ) 20 MG tablet  Prediabetes - Plan: Comprehensive metabolic panel with GFR, Hemoglobin A1c  Essential hypertension - Plan: CBC, Comprehensive metabolic panel with GFR, telmisartan  (MICARDIS ) 40 MG tablet  Thyroid  disorder screening - Plan: TSH  Other fatigue - Plan: VITAMIN D 25 Hydroxy (Vit-D Deficiency, Fractures)  Estrogen deficiency - Plan: DG Bone Density  Screening for lung cancer - Plan: CT CHEST LUNG CA SCREEN LOW DOSE W/O CM  Immunization due - Plan: Pneumococcal conjugate vaccine 20-valent (Prevnar 20)  Assessment & Plan Adult Wellness Visit Routine wellness visit with updated mammogram and colonoscopy. Declines pap - Ordered blood work: blood counts, metabolic profile, blood sugar, cholesterol, thyroid  function tests. - Provided hematology contact for PET scan follow-up.  Essential hypertension Blood pressure 143/87 mmHg. Previously on telmisartan , not currently taking. - Refilled telmisartan  prescription.  Hyperlipidemia Previously on pravastatin , not currently taking. - Refilled pravastatin  prescription.  Prediabetes- check a1c  Smoking history qualifies for lung cancer screening. - Ordered lung cancer screening CT.  Fatigue and  bone pain, unspecified Vitamin D deficiency considered. - Screen for vitamin D deficiency.  Cerumen impaction Hearing difficulty due to earwax. - Cleaned ears to remove cerumen.  Screening for osteoporosis No previous bone density scan on record. - Ordered bone density scan.  Screening for vitamin D deficiency Reports fatigue and bone pain. - Screen for vitamin D deficiency.  Immunization: influenza and pneumococcal Has not received recent flu or pneumonia vaccines. - Administered influenza vaccine. - Administered pneumococcal vaccine.  Signed Harlene Schroeder, MD Received labs, message to patient Results for orders placed or performed in visit on 05/31/24  CBC   Collection Time: 05/31/24 11:36 AM  Result Value Ref Range   WBC 5.8 4.0 - 10.5 K/uL   RBC 4.79 3.87 - 5.11 Mil/uL   Platelets 274.0 150.0 - 400.0 K/uL   Hemoglobin 14.0 12.0 - 15.0 g/dL   HCT 58.1 63.9 - 53.9 %   MCV 87.4 78.0 - 100.0 fl   MCHC 33.4 30.0 - 36.0 g/dL   RDW 86.4 88.4 - 84.4 %  Comprehensive metabolic  panel with GFR   Collection Time: 05/31/24 11:36 AM  Result Value Ref Range   Sodium 138 135 - 145 mEq/L   Potassium 4.3 3.5 - 5.1 mEq/L   Chloride 101 96 - 112 mEq/L   CO2 28 19 - 32 mEq/L   Glucose, Bld 92 70 - 99 mg/dL   BUN 16 6 - 23 mg/dL   Creatinine, Ser 9.38 0.40 - 1.20 mg/dL   Total Bilirubin 0.6 0.2 - 1.2 mg/dL   Alkaline Phosphatase 89 39 - 117 U/L   AST 12 0 - 37 U/L   ALT 10 0 - 35 U/L   Total Protein 7.2 6.0 - 8.3 g/dL   Albumin 4.4 3.5 - 5.2 g/dL   GFR 08.15 >39.99 mL/min   Calcium 9.4 8.4 - 10.5 mg/dL  Hemoglobin J8r   Collection Time: 05/31/24 11:36 AM  Result Value Ref Range   Hgb A1c MFr Bld 6.0 4.6 - 6.5 %  Lipid panel   Collection Time: 05/31/24 11:36 AM  Result Value Ref Range   Cholesterol 299 (H) 0 - 200 mg/dL   Triglycerides 887.9 0.0 - 149.0 mg/dL   HDL 52.49 >60.99 mg/dL   VLDL 77.5 0.0 - 59.9 mg/dL   LDL Cholesterol 770 (H) 0 - 99 mg/dL   Total CHOL/HDL  Ratio 6    NonHDL 251.06   TSH   Collection Time: 05/31/24 11:36 AM  Result Value Ref Range   TSH 1.10 0.35 - 5.50 uIU/mL  VITAMIN D 25 Hydroxy (Vit-D Deficiency, Fractures)   Collection Time: 05/31/24 11:36 AM  Result Value Ref Range   VITD 16.17 (L) 30.00 - 100.00 ng/mL

## 2024-05-31 ENCOUNTER — Encounter: Payer: Self-pay | Admitting: Family Medicine

## 2024-05-31 ENCOUNTER — Encounter: Admitting: Family Medicine

## 2024-05-31 ENCOUNTER — Ambulatory Visit: Admitting: Family Medicine

## 2024-05-31 VITALS — BP 146/94 | HR 96 | Temp 97.7°F | Ht 65.0 in | Wt 200.0 lb

## 2024-05-31 DIAGNOSIS — Z1329 Encounter for screening for other suspected endocrine disorder: Secondary | ICD-10-CM | POA: Diagnosis not present

## 2024-05-31 DIAGNOSIS — R5383 Other fatigue: Secondary | ICD-10-CM

## 2024-05-31 DIAGNOSIS — R7303 Prediabetes: Secondary | ICD-10-CM

## 2024-05-31 DIAGNOSIS — Z23 Encounter for immunization: Secondary | ICD-10-CM

## 2024-05-31 DIAGNOSIS — E2839 Other primary ovarian failure: Secondary | ICD-10-CM

## 2024-05-31 DIAGNOSIS — Z0000 Encounter for general adult medical examination without abnormal findings: Secondary | ICD-10-CM

## 2024-05-31 DIAGNOSIS — I10 Essential (primary) hypertension: Secondary | ICD-10-CM | POA: Diagnosis not present

## 2024-05-31 DIAGNOSIS — E785 Hyperlipidemia, unspecified: Secondary | ICD-10-CM

## 2024-05-31 DIAGNOSIS — Z122 Encounter for screening for malignant neoplasm of respiratory organs: Secondary | ICD-10-CM

## 2024-05-31 LAB — LIPID PANEL
Cholesterol: 299 mg/dL — ABNORMAL HIGH (ref 0–200)
HDL: 47.5 mg/dL (ref >39.00)
LDL Cholesterol: 229 mg/dL — ABNORMAL HIGH (ref 0–99)
NonHDL: 251.06
Total CHOL/HDL Ratio: 6
Triglycerides: 112 mg/dL (ref 0.0–149.0)
VLDL: 22.4 mg/dL (ref 0.0–40.0)

## 2024-05-31 LAB — COMPREHENSIVE METABOLIC PANEL WITH GFR
ALT: 10 U/L (ref 0–35)
AST: 12 U/L (ref 0–37)
Albumin: 4.4 g/dL (ref 3.5–5.2)
Alkaline Phosphatase: 89 U/L (ref 39–117)
BUN: 16 mg/dL (ref 6–23)
CO2: 28 meq/L (ref 19–32)
Calcium: 9.4 mg/dL (ref 8.4–10.5)
Chloride: 101 meq/L (ref 96–112)
Creatinine, Ser: 0.61 mg/dL (ref 0.40–1.20)
GFR: 91.84 mL/min (ref >60.00)
Glucose, Bld: 92 mg/dL (ref 70–99)
Potassium: 4.3 meq/L (ref 3.5–5.1)
Sodium: 138 meq/L (ref 135–145)
Total Bilirubin: 0.6 mg/dL (ref 0.2–1.2)
Total Protein: 7.2 g/dL (ref 6.0–8.3)

## 2024-05-31 LAB — CBC
HCT: 41.8 % (ref 36.0–46.0)
Hemoglobin: 14 g/dL (ref 12.0–15.0)
MCHC: 33.4 g/dL (ref 30.0–36.0)
MCV: 87.4 fl (ref 78.0–100.0)
Platelets: 274 K/uL (ref 150.0–400.0)
RBC: 4.79 Mil/uL (ref 3.87–5.11)
RDW: 13.5 % (ref 11.5–15.5)
WBC: 5.8 K/uL (ref 4.0–10.5)

## 2024-05-31 LAB — HEMOGLOBIN A1C: Hgb A1c MFr Bld: 6 % (ref 4.6–6.5)

## 2024-05-31 LAB — TSH: TSH: 1.1 u[IU]/mL (ref 0.35–5.50)

## 2024-05-31 LAB — VITAMIN D 25 HYDROXY (VIT D DEFICIENCY, FRACTURES): VITD: 16.17 ng/mL — ABNORMAL LOW (ref 30.00–100.00)

## 2024-05-31 MED ORDER — TELMISARTAN 40 MG PO TABS
40.0000 mg | ORAL_TABLET | Freq: Every day | ORAL | 3 refills | Status: AC
Start: 2024-05-31 — End: ?

## 2024-05-31 MED ORDER — PRAVASTATIN SODIUM 20 MG PO TABS
20.0000 mg | ORAL_TABLET | Freq: Every day | ORAL | 3 refills | Status: AC
Start: 2024-05-31 — End: ?

## 2024-05-31 NOTE — Patient Instructions (Signed)
 Good to see you today- I will be in touch with your labs asap Please call hematology/ oncology here at [facility] and set up a recheck appt  [address]  [phone]  I ordered a bone density scan and lung cancer screening CT Pneumonia and flu shots today

## 2024-07-02 ENCOUNTER — Telehealth: Payer: Self-pay | Admitting: Family Medicine

## 2024-07-02 NOTE — Telephone Encounter (Signed)
 Copied from CRM [id number]. Topic: Medicare AWV >> Jul 02, 2024 11:57 AM Nathanel DEL wrote: Called LVM 07/02/2024 to sched AWV. Please schedule AWV in office only.   Nathanel Paschal; Care Guide Ambulatory Clinical Support [place] [facility] Direct Dial: [phone]
